# Patient Record
Sex: Female | Born: 1965 | Race: Black or African American | Hispanic: No | Smoking: Never smoker
Health system: Southern US, Community
[De-identification: ages and names within clinical notes are randomized; demographics above are authoritative.]

## PROBLEM LIST (undated history)

## (undated) DIAGNOSIS — C801 Malignant (primary) neoplasm, unspecified: Secondary | ICD-10-CM

## (undated) DIAGNOSIS — D279 Benign neoplasm of unspecified ovary: Secondary | ICD-10-CM

## (undated) DIAGNOSIS — IMO0001 Reserved for inherently not codable concepts without codable children: Secondary | ICD-10-CM

## (undated) DIAGNOSIS — D649 Anemia, unspecified: Secondary | ICD-10-CM

## (undated) HISTORY — PX: OTHER SURGICAL HISTORY: SHX169

---

## 1998-03-03 ENCOUNTER — Emergency Department (HOSPITAL_COMMUNITY): Admission: EM | Admit: 1998-03-03 | Discharge: 1998-03-03 | Payer: Self-pay | Admitting: Emergency Medicine

## 1998-03-03 ENCOUNTER — Encounter: Payer: Self-pay | Admitting: Emergency Medicine

## 2007-07-24 ENCOUNTER — Encounter: Payer: Self-pay | Admitting: Family Medicine

## 2007-07-24 ENCOUNTER — Emergency Department (HOSPITAL_COMMUNITY): Admission: EM | Admit: 2007-07-24 | Discharge: 2007-07-24 | Payer: Self-pay | Admitting: Emergency Medicine

## 2007-07-24 LAB — CONVERTED CEMR LAB: Sodium: 136 meq/L

## 2007-08-18 ENCOUNTER — Ambulatory Visit: Payer: Self-pay | Admitting: Family Medicine

## 2007-08-18 ENCOUNTER — Encounter: Payer: Self-pay | Admitting: *Deleted

## 2007-08-18 DIAGNOSIS — Z9189 Other specified personal risk factors, not elsewhere classified: Secondary | ICD-10-CM | POA: Insufficient documentation

## 2007-08-18 LAB — CONVERTED CEMR LAB
BUN: 23 mg/dL
CO2: 23 meq/L
Chloride: 105 meq/L
Creatinine, Ser: 1.05 mg/dL
Glucose, Bld: 90 mg/dL
Potassium: 4.1 meq/L

## 2010-07-23 NOTE — Consult Note (Signed)
NAME:  Suzanne [name], Suzanne [name]          ACCOUNT NO.:  [id number]   MEDICAL RECORD NO.:  [id number]          PATIENT TYPE:  EMS   LOCATION:  [facility]                         FACILITY:  [facility]   PHYSICIAN:  Pearlean Brownie, M.D.DATE OF BIRTH:  Jul 12, 1965   DATE OF CONSULTATION:  DATE OF DISCHARGE:                                 CONSULTATION   CHIEF COMPLAINT:  Chest pain.   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  The patient notes  that the pain has a heavy, achy feeling, and is always present in the  background, but is worse with increased periods of stress.  The patient  feels like it has been a little more prominent recently, now her classes  have finished and she has more time to focus on it.  The patient notes  that the pain is worse with activity, but is better with rest especially  when she gets a few nights of rest in a row.  There is no radiation  through her jaw or her arm.  There is no numbness or tingling due to the  chest pain.  The patient occasionally has shortness of breath, but this  occurs with or without the chest pain.  The patient came in today to the  emergency department not because the pain was worse, but because she did  not have to work and had time.  The patient did not know where to go  be seen for this, so she came to the emergency department.  Of note, the  patient has no primary care [facility] and is currently uninsured.   MEDICATIONS:  1. Advil p.r.n.  2. Multivitamin daily.  3. Calcium daily.  4. Biotin daily.  5. __________ acid daily.  6. Phosphatidylserine.   ALLERGIES:  No known drug allergies.   PAST MEDICAL HISTORY:  1. 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  The patient had one-third of one ovary and two-      thirds of the other ovary remaining per  her report.  2. Ovarian cyst in 2001, removed by surgery.  The patient reports that      these cysts have returned.  The patient reports no other medical      problems for surgery.  The patient reports that her lipids have      been checked in the past that are borderline.   FAMILY HISTORY:  The patient's mother is in good health.  Maternal  grandmother died of congestive heart failure.  Maternal uncle had a  pacemaker placed.  Maternal aunt diet of coronary artery disease, first  cousin died of an enlarged heart per the patient's report.  There is no  history of early cardiac death, except for this cousin who died at age  61.  Of note, the patient's husband died at age 22 of a heart attack.   SOCIAL HISTORY:  The patient is a nonsmoker, occasional  alcohol use.  Denies any illegal drug use.  The patient works at [employer] part time and is  a Naval architect to become a Runner, broadcasting/film/video.  The patient reports no recent  stressors.  She lives alone.  The patient states that she tries to walk  for 1 hour a day for exercise.   REVIEW OF SYSTEMS:  The patient denies any bowel or bladder problems,  denies any back pain.  She states that she has occasional leg aching and  reports 1 or 2 varicose veins.  Chest pain symptoms are not worse with  eating.  They do not get worse at night.  The patient admits to some  problems with stress and worrying, but denies any panic attacks and has  not had any signs of impending doom.   PHYSICAL EXAMINATION:  VITAL SIGNS:  Temperature 98.7, pulse 69,  respiratory rate 20, blood pressure 117/70, and O2 sat 98% on room air.  GENERAL:  In no acute distress, alert and comfortable.  HEENT:  Pupils are equal, around, and reactive to light.  Extraocular  muscles were intact.  Conjunctivae are pink and sclerae are clear.  Funduscopic exam is negative for any retinal hemorrhage.  Disks are  normal.  Oropharynx is pink and moist without erythema or exudate.  NECK:  No thyromegaly.   Neck is soft and nontender.  CARDIOVASCULAR:  Regular rate and rhythm.  No murmurs, gallops, or rubs.  LUNGS:  Clear to auscultation bilaterally.  Work of breathing is  unlabored.  No wheezes, rales, or rhonchi.  CHEST:  Chest wall, pain is worse with palpation in xiphoid process.  There is no costovertebral angle tenderness.  ABDOMEN:  The patient is obese.  Positive bowel sounds.  Soft and  nontender.  There is no rebound or guarding.  EXTREMITIES:  2+ dorsalis pedis and radial pulses.  There is no  cyanosis, clubbing, or edema.  Extremities are warm and well-perfused.  SKIN:  Warm and well-hydrated.  There is no rash to inspection.   LABORATORY DATA:  I-STAT laboratory shows sodium of 136, potassium of  4.1, and chloride of 105.  BUN of 23, creatinine of 0.9, and CO2 of 23.  Hemoglobin of 12.9 and hematocrit is 38.0.  One set of cardiac enzymes  were drawn, CK-MB is less than 1.0 and troponin is less than 0.05.  Chest x-ray performed shows no acute abnormality.  EKG performed in the  emergency department shows normal sinus rhythm with no ST changes.  No  ST elevation or depression.   ASSESSMENT AND PLAN:  The patient is a 45 year old African American  female with no prior cardiac history, who presents a for long-standing  chest pain.  1. Chest pain.  This is not acute on presentation.  The patient      presents to the emergency department seeking evaluation for this      chronic problem.  EKG is normal.  Chest x-ray is normal.  Her      cardiac enzymes x1 are normal.  This is likely not cardiac in      etiology.  The patient has no significant family history of      coronary artery disease.  We will set the patient up with [facility] to get established      and provide primary care.  A referral to further primary care and      proceed with further outpatient cardiac workup  and further risk      stratification including likely stress  test, fasting lipid panel,      and another workup as indicated.  The patient is agreeable to this      and prefers discharged to home as she states she does need to go to      work tomorrow.  We will provide the patient with the prescription      for nitroglycerin and also advised aspirin with any chest pain      exacerbations.   1. Stress.  This is possibly the ultimate etiology of her complaint,      although this is not completely clear.  The patient does not      require any pharmacological treatment at this time.   DISPOSITION:  We will discharge the patient to home and asked [facility] staff to schedule the patient in an hospital  followup clinic in 2-3 weeks.  The patient's phone number is [phone].  The patient has been provided with the [facility]  phone number as well.   Pending result issues to be followed up.  The patient should have a  fasting lipid panel and consideration for exercise stress testing with  the baseline EKG performed during this emergency department stay.  The  patient should have other risk factor modification measures taken as  necessary.      Myrtie Soman, MD  Electronically Signed      Pearlean Brownie, M.D.  Electronically Signed    TE/MEDQ  D:  07/24/2007  T:  07/25/2007  Job:  [id number]

## 2010-12-04 LAB — POCT I-STAT, CHEM 8
BUN: 23
Calcium, Ion: 1.05 — ABNORMAL LOW
Chloride: 105
Creatinine, Ser: 0.9
Glucose, Bld: 90
HCT: 38
Hemoglobin: 12.9
Potassium: 4.1
Sodium: 136
TCO2: 23

## 2010-12-04 LAB — POCT CARDIAC MARKERS
CKMB, poc: <1 — ABNORMAL LOW
Myoglobin, poc: 59.6
Operator id: 294521
Troponin i, poc: <0.05

## 2012-01-28 ENCOUNTER — Ambulatory Visit (INDEPENDENT_AMBULATORY_CARE_PROVIDER_SITE_OTHER): Payer: BC Managed Care – PPO | Admitting: Family Medicine

## 2012-01-28 VITALS — BP 111/69 | HR 89 | Temp 98.7°F | Resp 16 | Ht 64.0 in | Wt 206.0 lb

## 2012-01-28 DIAGNOSIS — R42 Dizziness and giddiness: Secondary | ICD-10-CM

## 2012-01-28 DIAGNOSIS — R7309 Other abnormal glucose: Secondary | ICD-10-CM

## 2012-01-28 DIAGNOSIS — R739 Hyperglycemia, unspecified: Secondary | ICD-10-CM

## 2012-01-28 LAB — POCT UA - MICROSCOPIC ONLY
Bacteria, U Microscopic: NEGATIVE
Casts, Ur, LPF, POC: NEGATIVE
Crystals, Ur, HPF, POC: POSITIVE
Mucus, UA: NEGATIVE
RBC, urine, microscopic: NEGATIVE
Yeast, UA: NEGATIVE

## 2012-01-28 LAB — POCT SEDIMENTATION RATE: POCT SED RATE: 52 mm/hr — AB (ref 0–22)

## 2012-01-28 LAB — COMPREHENSIVE METABOLIC PANEL
ALT: 14 U/L (ref 0–35)
AST: 19 U/L (ref 0–37)
Albumin: 3.9 g/dL (ref 3.5–5.2)
Alkaline Phosphatase: 64 U/L (ref 39–117)
BUN: 12 mg/dL (ref 6–23)
CO2: 27 mEq/L (ref 19–32)
Calcium: 8.9 mg/dL (ref 8.4–10.5)
Chloride: 106 mEq/L (ref 96–112)
Creat: 0.97 mg/dL (ref 0.50–1.10)
Glucose, Bld: 126 mg/dL — ABNORMAL HIGH (ref 70–99)
Potassium: 4 mEq/L (ref 3.5–5.3)
Sodium: 138 mEq/L (ref 135–145)
Total Bilirubin: 0.3 mg/dL (ref 0.3–1.2)
Total Protein: 6.7 g/dL (ref 6.0–8.3)

## 2012-01-28 LAB — POCT CBC
Granulocyte percent: 72.2 %G (ref 37–80)
HCT, POC: 41.9 % (ref 37.7–47.9)
Hemoglobin: 12.7 g/dL (ref 12.2–16.2)
Lymph, poc: 2.2 (ref 0.6–3.4)
MCH, POC: 28.6 pg (ref 27–31.2)
MCHC: 30.3 g/dL — AB (ref 31.8–35.4)
MCV: 94.3 fL (ref 80–97)
MID (cbc): 0.3 (ref 0–0.9)
MPV: 8.2 fL (ref 0–99.8)
POC Granulocyte: 6.6 (ref 2–6.9)
POC LYMPH PERCENT: 24.5 %L (ref 10–50)
POC MID %: 3.3 %M (ref 0–12)
Platelet Count, POC: 344 10*3/uL (ref 142–424)
RBC: 4.44 M/uL (ref 4.04–5.48)
RDW, POC: 15.3 %
WBC: 9.1 10*3/uL (ref 4.6–10.2)

## 2012-01-28 LAB — POCT URINALYSIS DIPSTICK
Bilirubin, UA: NEGATIVE
Blood, UA: NEGATIVE
Glucose, UA: NEGATIVE
Leukocytes, UA: NEGATIVE
Nitrite, UA: NEGATIVE
Protein, UA: NEGATIVE
Spec Grav, UA: >=1.03
Urobilinogen, UA: 0.2
pH, UA: 5.5

## 2012-01-28 LAB — TSH: TSH: 1.887 u[IU]/mL (ref 0.350–4.500)

## 2012-01-28 LAB — POCT GLYCOSYLATED HEMOGLOBIN (HGB A1C): Hemoglobin A1C: 6.3

## 2012-01-28 LAB — GLUCOSE, POCT (MANUAL RESULT ENTRY): POC Glucose: 137 mg/dl — AB (ref 70–99)

## 2012-01-28 NOTE — Patient Instructions (Addendum)
I really think much of your dizziness and malaise stems from extreme job stress and lack of sleep.  I am checking your other labs to make sure there is nothing wrong with liver or kidneys.  If symptoms persist, I will refer you to a neurologist.    Stress Stress-related medical problems are becoming increasingly common. The body has a built-in physical response to stressful situations. Faced with pressure, challenge or danger, we need to react quickly. Our bodies release hormones such as cortisol and adrenaline to help do this. These hormones are part of the "fight or flight" response and affect the metabolic rate, heart rate and blood pressure, resulting in a heightened, stressed state that prepares the body for optimum performance in dealing with a stressful situation. It is likely that early man required these mechanisms to stay alive, but usually modern stresses do not call for this, and the same hormones released in today's world can damage health and reduce coping ability. CAUSES  Pressure to perform at work, at school or in sports.  Threats of physical violence.  Money worries.  Arguments.  Family conflicts.  Divorce or separation from significant other.  Bereavement.  New job or unemployment.  Changes in location.  Alcohol or drug abuse. SOMETIMES, THERE IS NO PARTICULAR REASON FOR DEVELOPING STRESS. Almost all people are at risk of being stressed at some time in their lives. It is important to know that some stress is temporary and some is long term.  Temporary stress will go away when a situation is resolved. Most people can cope with short periods of stress, and it can often be relieved by relaxing, taking a walk, chatting through issues with friends, or having a good night's sleep.  Chronic (long-term, continuous) stress is much harder to deal with. It can be psychologically and emotionally damaging. It can be harmful both for an individual and for friends and  family. SYMPTOMS Everyone reacts to stress differently. There are some common effects that help [name] recognize it. In times of extreme stress, people may:  Shake uncontrollably.  Breathe faster and deeper than normal (hyperventilate).  Vomit.  For people with asthma, stress can trigger an attack.  For some people, stress may trigger migraine headaches, ulcers, and body pain. PHYSICAL EFFECTS OF STRESS MAY INCLUDE:  Loss of energy.  Skin problems.  Aches and pains resulting from tense muscles, including neck ache, backache and tension headaches.  Increased pain from arthritis and other conditions.  Irregular heart beat (palpitations).  Periods of irritability or anger.  Apathy or depression.  Anxiety (feeling uptight or worrying).  Unusual behavior.  Loss of appetite.  Comfort eating.  Lack of concentration.  Loss of, or decreased, sex-drive.  Increased smoking, drinking, or recreational drug use.  For women, missed periods.  Ulcers, joint pain, and muscle pain. Post-traumatic stress is the stress caused by any serious accident, strong emotional damage, or extremely difficult or violent experience such as rape or war. Post-traumatic stress victims can experience mixtures of emotions such as fear, shame, depression, guilt or anger. It may include recurrent memories or images that may be haunting. These feelings can last for weeks, months or even years after the traumatic event that triggered them. Specialized treatment, possibly with medicines and psychological therapies, is available. If stress is causing physical symptoms, severe distress or making it difficult for you to function as normal, it is worth seeing your caregiver. It is important to remember that although stress is a usual part of life,  extreme or prolonged stress can lead to other illnesses that will need treatment. It is better to visit a doctor sooner rather than later. Stress has been linked to the  development of high blood pressure and heart disease, as well as insomnia and depression. There is no diagnostic test for stress since everyone reacts to it differently. But a caregiver will be able to spot the physical symptoms, such as:  Headaches.  Shingles.  Ulcers. Emotional distress such as intense worry, low mood or irritability should be detected when the doctor asks pertinent questions to identify any underlying problems that might be the cause. In case there are physical reasons for the symptoms, the doctor may also want to do some tests to exclude certain conditions. If you feel that you are suffering from stress, try to identify the aspects of your life that are causing it. Sometimes you may not be able to change or avoid them, but even a small change can have a positive ripple effect. A simple lifestyle change can make all the difference. STRATEGIES THAT CAN HELP DEAL WITH STRESS:  Delegating or sharing responsibilities.  Avoiding confrontations.  Learning to be more assertive.  Regular exercise.  Avoid using alcohol or street drugs to cope.  Eating a healthy, balanced diet, rich in fruit and vegetables and proteins.  Finding humor or absurdity in stressful situations.  Never taking on more than you know you can handle comfortably.  Organizing your time better to get as much done as possible.  Talking to friends or family and sharing your thoughts and fears.  Listening to music or relaxation tapes.  Tensing and then relaxing your muscles, starting at the toes and working up to the head and neck. If you think that you would benefit from help, either in identifying the things that are causing your stress or in learning techniques to help you relax, see a caregiver who is capable of helping you with this. Rather than relying on medications, it is usually better to try and identify the things in your life that are causing stress and try to deal with them. There are many  techniques of managing stress including counseling, psychotherapy, aromatherapy, yoga, and exercise. Your caregiver can help you determine what is best for you. Document Released: 05/17/2002 Document Revised: 05/19/2011 Document Reviewed: 04/13/2007 [facility] Patient Information 2013 [place], [place].

## 2012-01-28 NOTE — Progress Notes (Signed)
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  Non positional,  But worsened by activity.  Job is very stressful.   She has lost 20 lbs. This past year.  Onset: off and on over a year.  F/H:  Negative for diabetes, hypertension  Patient says she  Is premenstrual.  Objective:  Alert, appropriate, moving easily in office.  She broke into tears when describing her job. Eyes:  Normal fundi Tm's:  Normal Oroph: normal Neck:  Supple, no bruits, no thyromegaly Chest:  Clear Heart:  Regular, no murmur heard, no gallop Abdomen: soft, nontender, no HSM or mass Skin:  Clear Results for orders placed in visit on 01/28/12  POCT CBC      Component Value Range   WBC 9.1  4.6 - 10.2 K/uL   Lymph, poc 2.2  0.6 - 3.4   POC LYMPH PERCENT 24.5  10 - 50 %L   MID (cbc) 0.3  0 - 0.9   POC MID % 3.3  0 - 12 %M   POC Granulocyte 6.6  2 - 6.9   Granulocyte percent 72.2  37 - 80 %G   RBC 4.44  4.04 - 5.48 M/uL   Hemoglobin 12.7  12.2 - 16.2 g/dL   HCT, POC 16.1  09.6 - 47.9 %   MCV 94.3  80 - 97 fL   MCH, POC 28.6  27 - 31.2 pg   MCHC 30.3 (*) 31.8 - 35.4 g/dL   RDW, POC 04.5     Platelet Count, POC 344  142 - 424 K/uL   MPV 8.2  0 - 99.8 fL  GLUCOSE, POCT (MANUAL RESULT ENTRY)      Component Value Range   POC Glucose 137 (*) 70 - 99 mg/dl   Hgb W0J is 6.3  Assessment:  Patient is clearly stressed and is not sleeping enough.  I pointed this out to the patient, but she still thinks there is something wrong.  The blood sugar suggests patient is prediabetic  Plan:  1. Dizziness, nonspecific  POCT CBC, POCT glucose (manual entry), Comprehensive metabolic panel, POCT UA - Microscopic Only, POCT urinalysis dipstick, POCT SEDIMENTATION RATE, TSH  2. Elevated blood sugar  POCT glycosylated hemoglobin (Hb A1C)   Recommend CPE and possible neurology consult.

## 2012-03-17 ENCOUNTER — Ambulatory Visit (INDEPENDENT_AMBULATORY_CARE_PROVIDER_SITE_OTHER): Payer: BC Managed Care – PPO | Admitting: Family Medicine

## 2012-03-17 ENCOUNTER — Encounter: Payer: Self-pay | Admitting: Family Medicine

## 2012-03-17 VITALS — BP 116/70 | HR 89 | Temp 97.7°F | Resp 16 | Ht 66.0 in | Wt 202.0 lb

## 2012-03-17 DIAGNOSIS — Z862 Personal history of diseases of the blood and blood-forming organs and certain disorders involving the immune mechanism: Secondary | ICD-10-CM

## 2012-03-17 DIAGNOSIS — Z8639 Personal history of other endocrine, nutritional and metabolic disease: Secondary | ICD-10-CM

## 2012-03-17 DIAGNOSIS — R6889 Other general symptoms and signs: Secondary | ICD-10-CM

## 2012-03-17 DIAGNOSIS — Z0000 Encounter for general adult medical examination without abnormal findings: Secondary | ICD-10-CM

## 2012-03-17 DIAGNOSIS — R899 Unspecified abnormal finding in specimens from other organs, systems and tissues: Secondary | ICD-10-CM

## 2012-03-17 DIAGNOSIS — Z124 Encounter for screening for malignant neoplasm of cervix: Secondary | ICD-10-CM

## 2012-03-17 DIAGNOSIS — Z1231 Encounter for screening mammogram for malignant neoplasm of breast: Secondary | ICD-10-CM

## 2012-03-17 DIAGNOSIS — Z01419 Encounter for gynecological examination (general) (routine) without abnormal findings: Secondary | ICD-10-CM

## 2012-03-17 DIAGNOSIS — R5381 Other malaise: Secondary | ICD-10-CM

## 2012-03-17 DIAGNOSIS — N760 Acute vaginitis: Secondary | ICD-10-CM

## 2012-03-17 DIAGNOSIS — R5383 Other fatigue: Secondary | ICD-10-CM

## 2012-03-17 LAB — POCT URINALYSIS DIPSTICK
Leukocytes, UA: NEGATIVE
Nitrite, UA: NEGATIVE
Protein, UA: NEGATIVE
Urobilinogen, UA: 0.2
pH, UA: 6

## 2012-03-17 LAB — POCT WET PREP WITH KOH

## 2012-03-17 LAB — LIPID PANEL
LDL Cholesterol: 139 mg/dL — ABNORMAL HIGH (ref 0–99)
Triglycerides: 123 mg/dL (ref <150)

## 2012-03-17 MED ORDER — METRONIDAZOLE 500 MG PO TABS: ORAL_TABLET | ORAL | Status: DC

## 2012-03-17 NOTE — Progress Notes (Signed)
Subjective:    Patient ID: Suzanne [name], female    DOB: Sep 13, 1965, 47 y.o.   MRN: [id number]  HPI  This 47 y.o. AA female is here for CPE/ PAP. She takes no prescription medications. She c/o decreased appetite with dizziness. There has been some mild fatigue and arthralgias; she had an elevated ESR in Nov 2103.  Health care maintenance issues need to be addressed.   Review of Systems  Constitutional: Positive for appetite change and fatigue. Negative for fever, chills and unexpected weight change.  HENT: Positive for dental problem.   Eyes: Positive for visual disturbance.  Respiratory: Positive for cough.   Cardiovascular: Negative.   Gastrointestinal: Negative.   Musculoskeletal: Positive for arthralgias. Negative for myalgias, back pain and joint swelling.  Neurological: Positive for dizziness and light-headedness.  Hematological: Negative.   Psychiatric/Behavioral: Negative.        Objective:   Physical Exam  Nursing note and vitals reviewed. Constitutional: She is oriented to person, place, and time. Vital signs are normal. She appears well-developed and well-nourished. No distress.  HENT:  Head: Normocephalic and atraumatic.  Right Ear: Hearing, tympanic membrane, external ear and ear canal normal.  Left Ear: Hearing, tympanic membrane, external ear and ear canal normal.  Nose: Nose normal. No nasal deformity or septal deviation. Right sinus exhibits no maxillary sinus tenderness and no frontal sinus tenderness. Left sinus exhibits no maxillary sinus tenderness and no frontal sinus tenderness.  Mouth/Throat: Uvula is midline, oropharynx is clear and moist and mucous membranes are normal. No oral lesions. Normal dentition. No dental caries.  Eyes: Conjunctivae normal, EOM and lids are normal. Pupils are equal, round, and reactive to light. No scleral icterus.  Fundoscopic exam:      The right eye shows no arteriolar narrowing, no AV nicking and no papilledema. The  right eye shows red reflex.      The left eye shows no arteriolar narrowing, no AV nicking and no papilledema. The left eye shows red reflex. Neck: Normal range of motion. Neck supple. No thyromegaly present.  Cardiovascular: Normal rate, regular rhythm, normal heart sounds and intact distal pulses.  Exam reveals no gallop and no friction rub.   No murmur heard. Pulmonary/Chest: Effort normal and breath sounds normal. No respiratory distress. Right breast exhibits no inverted nipple, no mass, no nipple discharge, no skin change and no tenderness. Left breast exhibits no inverted nipple, no mass, no nipple discharge, no skin change and no tenderness. Breasts are symmetrical.  Abdominal: Soft. Normal appearance and bowel sounds are normal. She exhibits no distension, no abdominal bruit, no pulsatile midline mass and no mass. There is no hepatosplenomegaly. There is no tenderness. There is no rebound, no guarding and no CVA tenderness. No hernia.  Genitourinary: Rectum normal, vagina normal and uterus normal. Rectal exam shows no external hemorrhoid, no fissure, no mass and anal tone normal. Guaiac negative stool. There is no rash, tenderness or lesion on the right labia. There is no rash, tenderness or lesion on the left labia. Uterus is not enlarged and not tender. Cervix exhibits discharge. Cervix exhibits no motion tenderness and no friability. Right adnexum displays no mass, no tenderness and no fullness. Left adnexum displays no mass, no tenderness and no fullness. No erythema around the vagina. No foreign body around the vagina. No vaginal discharge found.  Musculoskeletal: Normal range of motion. She exhibits no edema and no tenderness.  Lymphadenopathy:    She has no cervical adenopathy.  Right: No inguinal adenopathy present.       Left: No inguinal adenopathy present.  Neurological: She is alert and oriented to person, place, and time. She has normal reflexes. No cranial nerve deficit. She  exhibits normal muscle tone. Coordination normal.  Skin: Skin is warm and dry. No rash noted. No erythema. No pallor.  Psychiatric: She has a normal mood and affect. Her behavior is normal. Judgment and thought content normal.          Assessment & Plan:   1. Routine general medical examination at a health care facility  POCT urinalysis dipstick, Lipid panel, IFOBT POC (occult bld, rslt in office)  2. Encounter for cervical Pap smear with pelvic exam  Pap IG, CT/NG w/ reflex HPV when ASC-U  3. Fatigue  Vitamin D, 25-hydroxy  4. Abnormal laboratory test result  Sedimentation rate  5. H/O hyperglycemia  A1c= 6.3% in Nov 2013. Pt advised that she has borderline Diabetes-encouraged better nutrition and more active lifestyle for weight reduction.  6. Vaginitis - Bacterial Vaginosis POCT Wet Prep with KOH, metroNIDAZOLE (FLAGYL) 500 MG tablet  7. Other screening mammogram  MM Digital Screening

## 2012-03-17 NOTE — Patient Instructions (Addendum)
Keeping You Healthy  Get These Tests  Blood Pressure- Have your blood pressure checked by your healthcare provider at least once a year.  Normal blood pressure is 120/80.  Weight- Have your body mass index (BMI) calculated to screen for obesity.  BMI is a measure of body fat based on height and weight.  You can calculate your own BMI at www.nhlbisupport.com/bmi/  Cholesterol- Have your cholesterol checked every year.  Diabetes- Have your blood sugar checked every year if you have high blood pressure, high cholesterol, a family history of diabetes or if you are overweight.  Pap Smear- Have a pap smear every 1 to 3 years if you have been sexually active.  If you are older than 65 and recent pap smears have been normal you may not need additional pap smears.  In addition, if you have had a hysterectomy  For benign disease additional pap smears are not necessary.  Mammogram-Yearly mammograms are essential for early detection of breast cancer  Screening for Colon Cancer- Colonoscopy starting at age 49. Screening may begin sooner depending on your family history and other health conditions.  Follow up colonoscopy as directed by your Gastroenterologist.  Screening for Osteoporosis- Screening begins at age 65 with bone density scanning, sooner if you are at higher risk for developing Osteoporosis.  Get these medicines  Calcium with Vitamin D- Your body requires 1200-1500 mg of Calcium a day and 800-1000 IU of Vitamin D a day.  You can only absorb 500 mg of Calcium at a time therefore Calcium must be taken in 2 or 3 separate doses throughout the day.  Hormones- Hormone therapy has been associated with increased risk for certain cancers and heart disease.  Talk to your healthcare provider about if you need relief from menopausal symptoms.  Aspirin- Ask your healthcare provider about taking Aspirin to prevent Heart Disease and Stroke.  Get these Immuniztions  Flu shot- Every fall  Pneumonia  shot- Once after the age of 49; if you are younger ask your healthcare provider if you need a pneumonia shot.  Tetanus- Every ten years.  Zostavax- Once after the age of 49 to prevent shingles.  Take these steps  Don't smoke- Your healthcare provider can help you quit. For tips on how to quit, ask your healthcare provider or go to www.smokefree.gov or call 1-800 QUIT-NOW.  Be physically active- Exercise 5 days a week for a minimum of 30 minutes.  If you are not already physically active, start slow and gradually work up to 30 minutes of moderate physical activity.  Try walking, dancing, bike riding, swimming, etc.  Eat a healthy diet- Eat a variety of healthy foods such as fruits, vegetables, whole grains, low fat milk, low fat cheeses, yogurt, lean meats, chicken, fish, eggs, dried beans, tofu, etc.  For more information go to www.thenutritionsource.org  Dental visit- Brush and floss teeth twice daily; visit your dentist twice a year.  Eye exam- Visit your Optometrist or Ophthalmologist yearly.  Drink alcohol in moderation- Limit alcohol intake to one drink or less a day.  Never drink and drive.  Depression- Your emotional health is as important as your physical health.  If you're feeling down or losing interest in things you normally enjoy, please talk to your healthcare provider.  Seat Belts- can save your life; always wear one  Smoke/Carbon Monoxide detectors- These detectors need to be installed on the appropriate level of your home.  Replace batteries at least once a year.  Violence- If anyone   is threatening or hurting you, please tell your healthcare provider.  Living Will/ Health care power of attorney- Discuss with your healthcare provider and family.     1800 Calorie Diet for Diabetes Meal Planning The 1800 calorie diet is designed for eating up to 1800 calories each day. Following this diet and making healthy meal choices can help improve overall health. This diet  controls blood sugar (glucose) levels and can also help lower blood pressure and cholesterol. SERVING SIZES Measuring foods and serving sizes helps to make sure you are getting the right amount of food. The list below tells how big or small some common serving sizes are:  1 oz.........4 stacked dice.  3 oz........Marland KitchenDeck of cards.  1 tsp.......Marland KitchenTip of little finger.  1 tbs......Marland KitchenMarland KitchenThumb.  2 tbs.......Marland KitchenGolf ball.   cup......Marland KitchenHalf of a fist.  1 cup.......Marland KitchenA fist. GUIDELINES FOR CHOOSING FOODS The goal of this diet is to eat a variety of foods and limit calories to 1800 each day. This can be done by choosing foods that are low in calories and fat. The diet also suggests eating small amounts of food frequently. Doing this helps control your blood glucose levels so they do not get too high or too low. Each meal or snack may include a protein food source to help you feel more satisfied and to stabilize your blood glucose. Try to eat about the same amount of food around the same time each day. This includes weekend days, travel days, and days off work. Space your meals about 4 to 5 hours apart and add a snack between them if you wish.  For example, a daily food plan could include breakfast, a morning snack, lunch, dinner, and an evening snack. Healthy meals and snacks include whole grains, vegetables, fruits, lean meats, poultry, fish, and dairy products. As you plan your meals, select a variety of foods. Choose from the bread and starch, vegetable, fruit, dairy, and meat/protein groups. Examples of foods from each group and their suggested serving sizes are listed below. Use measuring cups and spoons to become familiar with what a healthy portion looks like. Bread and Starch Each serving equals 15 grams of carbohydrates.  1 slice bread.   bagel.   cup cold cereal (unsweetened).   cup hot cereal or mashed potatoes.  1 small potato (size of a computer mouse).   cup cooked pasta or  rice.   English muffin.  1 cup broth-based soup.  3 cups of popcorn.  4 to 6 whole-wheat crackers.   cup cooked beans, peas, or corn. Vegetable Each serving equals 5 grams of carbohydrates.   cup cooked vegetables.  1 cup raw vegetables.   cup tomato or vegetable juice. Fruit Each serving equals 15 grams of carbohydrates.  1 small apple or orange.  1 cup watermelon or strawberries.   cup applesauce (no sugar added).  2 tbs raisins.   banana.   cup canned fruit, packed in water, its own juice, or sweetened with a sugar substitute.   cup unsweetened fruit juice. Dairy Each serving equals 12 to 15 grams of carbohydrates.  1 cup fat-free milk.  6 oz artificially sweetened yogurt or plain yogurt.  1 cup low-fat buttermilk.  1 cup soy milk.  1 cup almond milk. Meat/Protein  1 large egg.  2 to 3 oz meat, poultry, or fish.   cup low-fat cottage cheese.  1 tbs peanut butter.  1 oz low-fat cheese.   cup tuna in water.   cup tofu. Fat  1 tsp oil.  1 tsp trans-fat-free margarine.  1 tsp butter.  1 tsp mayonnaise.  2 tbs avocado.  1 tbs salad dressing.  1 tbs cream cheese.  2 tbs sour cream. SAMPLE 1800 CALORIE DIET PLAN Breakfast   cup unsweetened cereal (1 carb serving).  1 cup fat-free milk (1 carb serving).  1 slice whole-wheat toast (1 carb serving).   small banana (1 carb serving).  1 scrambled egg.  1 tsp trans-fat-free margarine. Lunch  Tuna sandwich.  2 slices whole-wheat bread (2 carb servings).   cup canned tuna in water, drained.  1 tbs reduced fat mayonnaise.  1 stalk celery, chopped.  2 slices tomato.  1 lettuce leaf.  1 cup carrot sticks.  24 to 30 seedless grapes (2 carb servings).  6 oz light yogurt (1 carb serving). Afternoon Snack  3 graham cracker squares (1 carb serving).  Fat-free milk, 1 cup (1 carb serving).  1 tbs peanut butter. Dinner  3 oz salmon, broiled with 1 tsp  oil.  1 cup mashed potatoes (2 carb servings) with 1 tsp trans-fat-free margarine.  1 cup fresh or frozen green beans.  1 cup steamed asparagus.  1 cup fat-free milk (1 carb serving). Evening Snack  3 cups air-popped popcorn (1 carb serving).  2 tbs parmesan cheese sprinkled on top. MEAL PLAN Use this worksheet to help you make a daily meal plan based on the 1800 calorie diet suggestions. If you are using this plan to help you control your blood glucose, you may interchange carbohydrate-containing foods (dairy, starches, and fruits). Select a variety of fresh foods of varying colors and flavors. The total amount of carbohydrate in your meals or snacks is more important than making sure you include all of the food groups every time you eat. Choose from the following foods to build your day's meals:  8 Starches.  4 Vegetables.  3 Fruits.  2 Dairy.  6 to 7 oz Meat/Protein.  Up to 4 Fats. Your dietician can use this worksheet to help you decide how many servings and which types of foods are right for you. BREAKFAST Food Group and Servings / Food Choice Starch ________________________________________________________ Dairy _________________________________________________________ Fruit _________________________________________________________ Meat/Protein __________________________________________________ Fat ___________________________________________________________ LUNCH Food Group and Servings / Food Choice Starch ________________________________________________________ Meat/Protein __________________________________________________ Vegetable _____________________________________________________ Fruit _________________________________________________________ Dairy _________________________________________________________ Fat ___________________________________________________________ Aura Fey Food Group and Servings / Food Choice Starch  ________________________________________________________ Meat/Protein __________________________________________________ Fruit __________________________________________________________ Dairy _________________________________________________________ Laural Golden Food Group and Servings / Food Choice Starch _________________________________________________________ Meat/Protein ___________________________________________________ Dairy __________________________________________________________ Vegetable ______________________________________________________ Fruit ___________________________________________________________ Fat ____________________________________________________________ Lollie Sails Food Group and Servings / Food Choice Fruit __________________________________________________________ Meat/Protein ___________________________________________________ Dairy __________________________________________________________ Starch _________________________________________________________ DAILY TOTALS Starch ____________________________ Vegetable _________________________ Fruit _____________________________ Dairy _____________________________ Meat/Protein______________________ Fat _______________________________ Document Released: 09/16/2004 Document Revised: 05/19/2011 Document Reviewed: 01/10/2011 [employer] Patient Information 2013 [place], [place].     Bacterial Vaginosis Bacterial vaginosis (BV) is a vaginal infection where the normal balance of bacteria in the vagina is disrupted. The normal balance is then replaced by an overgrowth of certain bacteria. There are several different kinds of bacteria that can cause BV. BV is the most common vaginal infection in women of childbearing age. CAUSES   The cause of BV is not fully understood. BV develops when there is an increase or imbalance of harmful bacteria.  Some activities or behaviors can upset the normal balance of bacteria in the  vagina and put women at increased risk including:  Having a new sex partner or multiple sex partners.  Douching.  Using an intrauterine device (IUD) for contraception.  It is not clear what role sexual activity plays in the development of BV. However, women that have never had sexual intercourse are rarely infected with BV. Women do not get BV from toilet seats, bedding, swimming pools or from touching objects around them.  SYMPTOMS   Grey vaginal discharge.  A fish-like odor with discharge, especially after sexual intercourse.  Itching or burning of the vagina and vulva.  Burning or pain with urination.  Some women have no signs or symptoms at all. DIAGNOSIS  Your caregiver must examine the vagina for signs of BV. Your caregiver will perform lab tests and look at the sample of vaginal fluid through a microscope. They will look for bacteria and abnormal cells (clue cells), a pH test higher than 4.5, and a positive amine test all associated with BV.  RISKS AND COMPLICATIONS   Pelvic inflammatory disease (PID).  Infections following gynecology surgery.  Developing HIV.  Developing herpes virus. TREATMENT  Sometimes BV will clear up without treatment. However, all women with symptoms of BV should be treated to avoid complications, especially if gynecology surgery is planned. Female partners generally do not need to be treated. However, BV may spread between female sex partners so treatment is helpful in preventing a recurrence of BV.   BV may be treated with antibiotics. The antibiotics come in either pill or vaginal cream forms. Either can be used with nonpregnant or pregnant women, but the recommended dosages differ. These antibiotics are not harmful to the baby.  BV can recur after treatment. If this happens, a second round of antibiotics will often be prescribed.  Treatment is important for pregnant women. If not treated, BV can cause a premature delivery, especially for a  pregnant woman who had a premature birth in the past. All pregnant women who have symptoms of BV should be checked and treated.  For chronic reoccurrence of BV, treatment with a type of prescribed gel vaginally twice a week is helpful. HOME CARE INSTRUCTIONS   Finish all medication as directed by your caregiver.  Do not have sex until treatment is completed.  Tell your sexual partner that you have a vaginal infection. They should see their caregiver and be treated if they have problems, such as a mild rash or itching.  Practice safe sex. Use condoms. Only have 1 sex partner. PREVENTION  Basic prevention steps can help reduce the risk of upsetting the natural balance of bacteria in the vagina and developing BV:  Do not have sexual intercourse (be abstinent).  Do not douche.  Use all of the medicine prescribed for treatment of BV, even if the signs and symptoms go away.  Tell your sex partner if you have BV. That way, they can be treated, if needed, to prevent reoccurrence. SEEK MEDICAL CARE IF:   Your symptoms are not improving after 3 days of treatment.  You have increased discharge, pain, or fever. MAKE SURE YOU:   Understand these instructions.  Will watch your condition.  Will get help right away if you are not doing well or get worse. FOR MORE INFORMATION  Division of STD Prevention (DSTDP), Centers for Disease Control and Prevention: SolutionApps.co.za American Social Health Association (ASHA): www.ashastd.org  Document Released: 02/24/2005 Document Revised: 05/19/2011 Document Reviewed: 08/17/2008 [facility] Patient Information 2013 [place], [place].

## 2012-03-18 LAB — VITAMIN D 25 HYDROXY (VIT D DEFICIENCY, FRACTURES): Vit D, 25-Hydroxy: 18 ng/mL — ABNORMAL LOW (ref 30–89)

## 2012-03-19 LAB — PAP IG, CT-NG, RFX HPV ASCU: Chlamydia Probe Amp: NEGATIVE

## 2012-03-23 ENCOUNTER — Encounter: Payer: Self-pay | Admitting: Family Medicine

## 2012-03-24 ENCOUNTER — Other Ambulatory Visit: Payer: Self-pay | Admitting: Family Medicine

## 2012-03-24 MED ORDER — ERGOCALCIFEROL 1.25 MG (50000 UT) PO CAPS: 50000.0000 [IU] | ORAL_CAPSULE | ORAL | Status: DC

## 2012-03-24 NOTE — Progress Notes (Signed)
Quick Note:  Please call pt and advise that the following labs are abnormal... Vitamin level is very low; this is probably the reason that you are tired. I am prescribing a supplement (Vitamin D 16109 units) to be taken once a week. Also needs to increase Vitamin D foods in diet (fish, mushrooms, eggs and Vit D- fortified dairy products). Sun exposure for 15 minutes most days of the week is important. Sed rate test is still above normal but it is less than in Nov 2013. Will recheck this lab at next visit in April.  Lipid panel is good (Total cholesterol is a little above normal as is LDL- "bad" cholesterol). Eat as healthy as possible; check out information about the "Mediterranean Diet" online.  PAP is normal.  Copy to pt. ______

## 2012-04-05 ENCOUNTER — Ambulatory Visit (HOSPITAL_COMMUNITY)
Admission: RE | Admit: 2012-04-05 | Discharge: 2012-04-05 | Disposition: A | Discharge status: Home / Self Care | Payer: BC Managed Care – PPO | Source: Ambulatory Visit | Attending: Family Medicine | Admitting: Family Medicine

## 2012-04-05 DIAGNOSIS — Z1231 Encounter for screening mammogram for malignant neoplasm of breast: Secondary | ICD-10-CM | POA: Insufficient documentation

## 2012-04-07 ENCOUNTER — Telehealth: Payer: Self-pay

## 2012-04-07 DIAGNOSIS — N760 Acute vaginitis: Secondary | ICD-10-CM

## 2012-04-07 NOTE — Telephone Encounter (Signed)
Pt calling about an rx that was supposed to be called in to [facility] on [address]   Best number [phone]

## 2012-04-08 MED ORDER — ERGOCALCIFEROL 1.25 MG (50000 UT) PO CAPS: 50000.0000 [IU] | ORAL_CAPSULE | ORAL | Status: AC

## 2012-04-08 MED ORDER — METRONIDAZOLE 500 MG PO TABS: ORAL_TABLET | ORAL | Status: DC

## 2012-04-08 NOTE — Telephone Encounter (Signed)
Patient advised this was resent for her.

## 2012-04-08 NOTE — Telephone Encounter (Signed)
Medication (Flagyl) approved.

## 2012-04-08 NOTE — Telephone Encounter (Signed)
LMOM to CB to discuss which medication she is referring to.

## 2012-04-08 NOTE — Telephone Encounter (Signed)
Patient wants to know if we can send in the Flagyl in for her. She states she did not get it from the pharmacy when it was sent in for her at the beginning of the month. She also states she did not get Vitamin D rx either. I resubmitted this one, Please advise. Pended request for the Flagyl

## 2012-04-09 ENCOUNTER — Telehealth: Payer: Self-pay | Admitting: Radiology

## 2012-04-09 DIAGNOSIS — R928 Other abnormal and inconclusive findings on diagnostic imaging of breast: Secondary | ICD-10-CM

## 2012-04-09 NOTE — Telephone Encounter (Signed)
Message copied by Caffie Damme on Fri Apr 09, 2012 12:09 PM ------      Message from: Zenovia Jarred      Created: Wed Apr 07, 2012 12:21 PM      Regarding: Patient infor needs diag        Need epic order,poss asymmetry,right.      LTD diag right       Right breast US

## 2012-04-09 NOTE — Telephone Encounter (Signed)
Order generated, to you FYI patient has been called back for additional mammogram views and [name]. Suzanne [name]

## 2012-05-21 ENCOUNTER — Other Ambulatory Visit: Payer: BC Managed Care – PPO

## 2012-06-16 ENCOUNTER — Ambulatory Visit: Payer: BC Managed Care – PPO | Admitting: Family Medicine

## 2012-06-25 ENCOUNTER — Other Ambulatory Visit: Payer: BC Managed Care – PPO

## 2013-04-03 ENCOUNTER — Emergency Department (INDEPENDENT_AMBULATORY_CARE_PROVIDER_SITE_OTHER)
Admission: EM | Admit: 2013-04-03 | Discharge: 2013-04-03 | Disposition: A | Discharge status: Home / Self Care | Payer: Self-pay | Source: Home / Self Care

## 2013-04-03 ENCOUNTER — Encounter (HOSPITAL_COMMUNITY): Payer: Self-pay | Admitting: Emergency Medicine

## 2013-04-03 ENCOUNTER — Emergency Department (INDEPENDENT_AMBULATORY_CARE_PROVIDER_SITE_OTHER): Payer: Self-pay

## 2013-04-03 DIAGNOSIS — M25561 Pain in right knee: Secondary | ICD-10-CM

## 2013-04-03 DIAGNOSIS — M76892 Other specified enthesopathies of left lower limb, excluding foot: Secondary | ICD-10-CM

## 2013-04-03 DIAGNOSIS — S82113A Displaced fracture of unspecified tibial spine, initial encounter for closed fracture: Secondary | ICD-10-CM

## 2013-04-03 DIAGNOSIS — M6580 Other synovitis and tenosynovitis, unspecified site: Secondary | ICD-10-CM

## 2013-04-03 DIAGNOSIS — S82109A Unspecified fracture of upper end of unspecified tibia, initial encounter for closed fracture: Secondary | ICD-10-CM

## 2013-04-03 DIAGNOSIS — M25569 Pain in unspecified knee: Secondary | ICD-10-CM

## 2013-04-03 HISTORY — DX: Benign neoplasm of unspecified ovary: D27.9

## 2013-04-03 MED ORDER — HYDROCODONE-ACETAMINOPHEN 5-325 MG PO TABS
ORAL_TABLET | ORAL | Status: AC
  Filled 2013-04-03: qty 1

## 2013-04-03 MED ORDER — HYDROCODONE-ACETAMINOPHEN 5-325 MG PO TABS: 1.0000 | ORAL_TABLET | ORAL | Status: DC | PRN

## 2013-04-03 MED ORDER — NAPROXEN 375 MG PO TABS: 375.0000 mg | ORAL_TABLET | Freq: Two times a day (BID) | ORAL | Status: DC

## 2013-04-03 MED ORDER — HYDROCODONE-ACETAMINOPHEN 5-325 MG PO TABS
1.0000 | ORAL_TABLET | Freq: Once | ORAL | Status: AC
  Administered 2013-04-03: 1 via ORAL

## 2013-04-03 NOTE — ED Provider Notes (Signed)
CSN: [id number]     Arrival date & time 04/03/13  1619 History   First MD Initiated Contact with Patient 04/03/13 1720     Chief Complaint  Patient presents with  . Knee Pain   (Consider location/radiation/quality/duration/timing/severity/associated sxs/prior Treatment) HPI Comments: 48 year old female complaining of acute on chronic right knee pain for 2 weeks. She has had some swelling but eyes help diminish it. Today she stepped on her knee with full weightbearing and calls for much pain in the knee. Unable to bear full weight.   Past Medical History  Diagnosis Date  . Ovarian tumor (benign)    Past Surgical History  Procedure Laterality Date  . Ovarian tumor removal     Family History  Problem Relation Age of Onset  . Arthritis Mother    History  Substance Use Topics  . Smoking status: Never Smoker   . Smokeless tobacco: Not on file  . Alcohol Use: 0.0 oz/week     Comment: OCCASIONAL   OB History   Grav Para Term Preterm Abortions TAB SAB Ect Mult Living                 Review of Systems  Constitutional: Negative for fever, chills and activity change.  HENT: Negative.   Respiratory: Negative.   Cardiovascular: Negative.   Musculoskeletal:       As per HPI  Skin: Negative for color change, pallor and rash.  Neurological: Negative.     Allergies  Review of patient's allergies indicates no known allergies.  Home Medications   Current Outpatient Rx  Name  Route  Sig  Dispense  Refill  . Ascorbic Acid (VITAMIN C) 100 MG tablet   Oral   Take by mouth daily.         Marland Kitchen b complex vitamins tablet   Oral   Take 1 tablet by mouth daily.         . ergocalciferol (DRISDOL) 50000 UNITS capsule   Oral   Take 1 capsule (50,000 Units total) by mouth once a week.   4 capsule   5   . Multiple Vitamin (MULTIVITAMIN) tablet   Oral   Take 1 tablet by mouth daily.         Marland Kitchen HYDROcodone-acetaminophen (NORCO/VICODIN) 5-325 MG per tablet   Oral   Take 1 tablet  by mouth every 4 (four) hours as needed.   20 tablet   0   . metroNIDAZOLE (FLAGYL) 500 MG tablet      Take 1 tablet twice a day for 7 days.   14 tablet   0   . naproxen (NAPROSYN) 375 MG tablet   Oral   Take 1 tablet (375 mg total) by mouth 2 (two) times daily.   20 tablet   0    BP 137/72  Pulse 76  Temp(Src) 98.3 F (36.8 C) (Oral)  Resp 16  SpO2 100%  LMP 03/28/2013 Physical Exam  Nursing note and vitals reviewed. Constitutional: She is oriented to person, place, and time. She appears well-developed and well-nourished. No distress.  HENT:  Head: Normocephalic and atraumatic.  Eyes: EOM are normal.  Neck: Normal range of motion. Neck supple.  Musculoskeletal:  Knee without apparent swelling or discoloration. No anterior, lateral or medial tenderness. There is tenderness to the posterior tendons of the hamstring muscle. She is able to extend the knee but with pain. She is able to flex with some assistance to 100. She states the pain feels deep. Distal neurovascular motor  sensory is normal. Pedal pulses 2+.  Neurological: She is alert and oriented to person, place, and time. No cranial nerve deficit.  Skin: Skin is warm and dry.    ED Course  Procedures (including critical care time) Labs Review Labs Reviewed - No data to display Imaging Review Dg Knee Complete 4 Views Right  04/03/2013   CLINICAL DATA:  Pain.  EXAM: RIGHT KNEE - COMPLETE 4+ VIEW  COMPARISON:  None.  FINDINGS: Fracture of the medial tibial spine noted, age undetermined. MRI can be obtained for further evaluation. Tiny knee joint effusion cannot be excluded. Mild patellofemoral degenerative change present.  IMPRESSION: 1. Fracture of the medial tibial spine, age undetermined. 2. Small knee joint effusion. 3. Patellofemoral degenerative change. MRI may prove useful for further evaluation.   Electronically Signed   By: Marcello Moores  Register   On: 04/03/2013 17:54      MDM   1. Right knee pain   2.  Fracture of tibial spine, closed   3. Tendinitis of left knee      Crutches and knee immobilizer norco for pain Ice F/U with ortho as above.    Suzanne Napoleon, NP 04/03/13 1821  Suzanne Napoleon, NP 04/03/13 [phone]

## 2013-04-03 NOTE — ED Notes (Signed)
Has been having some right knee pain x 2-3 wks; was walking into work today when she "twisted" her right knee - now unable to bear weight on RLE.  Has applied ice and elevated RLE.

## 2013-04-03 NOTE — ED Notes (Signed)
Crackers provided.

## 2013-04-03 NOTE — Discharge Instructions (Signed)
Knee Pain °Knee pain can be a result of an injury or other medical conditions. Treatment will depend on the cause of your pain. °HOME CARE °· Only take medicine as told by your doctor. °· Keep a healthy weight. Being overweight can make the knee hurt more. °· Stretch before exercising or playing sports. °· If there is constant knee pain, change the way you exercise. Ask your doctor for advice. °· Make sure shoes fit well. Choose the right shoe for the sport or activity. °· Protect your knees. Wear kneepads if needed. °· Rest when you are tired. °GET HELP RIGHT AWAY IF:  °· Your knee pain does not stop. °· Your knee pain does not get better. °· Your knee joint feels hot to the touch. °· You have a fever. °MAKE SURE YOU:  °· Understand these instructions. °· Will watch this condition. °· Will get help right away if you are not doing well or get worse. °Document Released: 05/23/2008 Document Revised: 05/19/2011 Document Reviewed: 05/23/2008 °[employer]® Patient Information ©2014 [employer]. ° °Knee Bracing °Knee braces are supports to help stabilize and protect an injured or painful knee. They come in many different styles. They should support and protect the knee without increasing the chance of other injuries to yourself or others. It is important not to have a false sense of security when using a brace. Knee braces that help you to keep using your knee: °· Do not restore normal knee stability under high stress forces. °· May decrease some aspects of athletic performance. °Some of the different types of knee braces are: °· Prophylactic knee braces are designed to prevent or reduce the severity of knee injuries during sports that make injury to the knee more likely. °· Rehabilitative knee braces are designed to allow protected motion of: °· Injured knees. °· Knees that have been treated with or without surgery. °There is no evidence that the use of a supportive knee brace protects the graft following a successful  anterior cruciate ligament (ACL) reconstruction. However, braces are sometimes used to:  °· Protect injured ligaments. °· Control knee movement during the initial healing period. °They may be used as part of the treatment program for the various injured ligaments or cartilage of the knee including the: °· Anterior cruciate ligament. °· Medial collateral ligament. °· Medial or lateral cartilage (meniscus). °· Posterior cruciate ligament. °· Lateral collateral ligament. °Rehabilitative knee braces are most commonly used: °· During crutch-assisted walking right after injury. °· During crutch-assisted walking right after surgery to repair the cartilage and/or cruciate ligament injury. °· For a short period of time, 2-8 weeks, after the injury or surgery. °The value of a rehabilitative brace as opposed to a cast or splint includes the: °· Ability to adjust the brace for swelling. °· Ability to remove the brace for examinations, icing or showering. °· Ability to allow for movement in a controlled range of motion. °Functional knee braces give support to knees that have already been injured. They are designed to provide stability for the injured knee and provide protection after repair. Functional knee braces may not affect performance much. Lower extremity muscle strengthening, flexibility, and improvement in technique are more important than bracing in treating ligamentous knee injuries. Functional braces are not a substitute for rehabilitation or surgical procedures. °Unloader/offloader braces are designed to provide pain relief in arthritic knees. Patients with wear and tear arthritis from growing old or from an old cartilage injury (osteoarthritis) of the knee, and bow legged (varus) or knock   knee (valgus) deformities, often develop increased pain in the arthritic side due to increased loading. Unloader/offloader braces are made to reduce uneven loading in such knees. There is reduction in bowing out movement in bow  legged knees when the correct unloader brace is used. Patients with advanced osteoarthritis or severe varus or valgus alignment problems would not likely benefit from bracing. °Patellofemoral braces help the kneecap to move smoothly and well centered over the end of the femur in the knee.  °Most people who wear knee braces feel that they help. However, there is a lack of scientific evidence that knee braces are helpful at the level needed for athletic participation to prevent injury. In spite of this, athletes report an increase in knee stability, pain relief, performance improvement, and confidence during athletics when using a brace.  °Different knee problems require different knee braces: °· Your caregiver may suggest one kind of knee brace after knee surgery. °· A caregiver may choose another kind of knee brace for support instead of surgery for some types of torn ligaments. °· You may also need one for pain in the front of your knee that is not getting better with strengthening and flexibility exercises. °Get your caregiver's advice if you want to try a knee brace. The caregiver will advise you on where to get them and provide a prescription when it is needed to fashion and/or fit the brace. °Knee braces are the least important part of preventing knee injuries or getting better following injury. Stretching, strengthening and technique improvement are far more important in caring for and preventing knee injuries. When strengthening your knee, increase your activities a little at a time so as not to develop injuries from over use. Work out an exercise plan with your caregiver and/or physical therapist to get the best program for you. Do not let a knee brace become a crutch. °Always remember, there are no braces which support the knee as well as your original ligaments and cartilage you were born with. Conditioning, proper warm-up and stretching remain the most important parts of keeping your knees healthy. °HOW  TO USE A KNEE BRACE °· During sports, knee braces should be used as directed by your caregiver. °· Make sure that the hinges are where the knee bends. °· Straps, tapes, or hook-and-loop tapes should be fastened around your leg as instructed. °· You should check the placement of the brace during activities to make sure that it has not moved. Poorly positioned braces can hurt rather than help you. °· To work well, a knee brace should be worn during all activities that put you at risk of knee injury. °· Warm up properly before beginning athletic activities. °HOME CARE INSTRUCTIONS °· Knee braces often get damaged during normal use. Replace worn-out braces for maximum benefit. °· Clean regularly with soap and water. °· Inspect your brace often for wear and tear. °· Cover exposed metal to protect others from injury. °· Durable materials may cost more, but last longer. °SEEK IMMEDIATE MEDICAL CARE IF:  °· Your knee seems to be getting worse rather than better. °· You have increasing pain or swelling in the knee. °· You have problems caused by the knee brace. °· You have increased swelling or inflammation (redness or soreness) in your knee. °· Your knee becomes warm and more painful and you develop an unexplained temperature over 101° F (38.3° C). °MAKE SURE YOU:  °· Understand these instructions. °· Will watch your condition. °· Will get help right away if   you are not doing well or get worse. See your caregiver, physical therapist or orthopedic surgeon for additional information. Document Released: 05/17/2003 Document Revised: 05/19/2011 Document Reviewed: 08/23/2008 [facility] Patient Information 2014 [place], [place].  Document Released: 06/06/2010 Document Revised: 05/19/2011 Document Reviewed: 06/06/2010 [facility] Patient Information 2014 [place], [place].  Tibial Plateau Fracture with Rehab The tibial plateau is the top surface of the shinbone (tibia) and is part of the knee joint. A tibial plateau fracture is a  break that occurs in this portion of the shinbone. Tibial plateau fractures are fairly common. This injury is also known as a "bumper injury," because it often occurs when a person is hit by the bumper of a car.  SYMPTOMS   Severe pain at the fracture site, at the time of injury, which may continue over a period of time.  Tenderness, inflammation, and/or bruising (contusion) over the fracture site.  Decreased knee function.  Inability to stand or walk on the injured leg.  Visible deformity, if the bone fragments are not properly aligned (displaced fracture).  Signs of vascular damage: numbness and coldness below the injury site. CAUSES  Tibial plateau fractures occur when a force is placed on the bone that is greater than it can withstand. Common causes of injury include:  Direct hit (trauma) to the tibial plateau.  Indirect trauma, such as a twisting or bending injury. RISK INCREASES WITH:  Contact sports (football, rugby, lacrosse).  Motor sports.  Bone disease (osteoporosis, bone tumor).  Metabolism disorders, hormone problems, nutritional deficiency, or eating disorders (anorexia, bulimia).  Poor strength and flexibility. PREVENTION  Warm up and stretch properly before activity.  Maintain physical fitness:  Strength, flexibility, and endurance.  Cardiovascular fitness.  Wear properly fitted and padded protective equipment (i.e. shin guards for soccer). PROGNOSIS  If treated properly, tibial plateau fractures usually heal.  RELATED COMPLICATIONS   Fracture fails to heal (nonunion).  Fracture heals in a poor position (malunion).  Bleeding within the leg that leads to the squeezing of nerves inside a closed space (compartment syndrome), causing injury to the nerves or vessels in the leg.  Shortening of the injured bones.  Shinbone growth stopping in children.  Infection, if the skin is broken over the fracture site (open fracture).  Arthritis of the  knee.  Longer healing time, if not properly treated or re-injured.  Stiff knee.  Risks of surgery: infection, bleeding, nerve damage, or damage to surrounding tissues. TREATMENT Treatment first involves the use of ice and medicine to reduce pain and inflammation. If the bone fragments are out of alignment (displaced fracture), immediate realignment of the bone (reduction) by a trained professional is required. Fractures that cannot be realigned by hand, or are open (bones poking through the skin), may require surgery to hold the fracture in place with screws, pins, and plates. Once the bone is aligned, the knee should be restrained to allow for healing. After restraint, it is important to perform strengthening and stretching exercises to help regain strength and a full range of motion. These exercises may be completed at home or with a therapist.  MEDICATION   If pain medicine is needed, nonsteroidal anti-inflammatory medicines (aspirin and ibuprofen), or other minor pain relievers (acetaminophen), are often recommended.  Do not take pain medicine for 7 days before surgery.  Prescription pain relievers may be given if your caregiver thinks they are needed. Use only as directed and only as much as you need. COLD THERAPY  Cold treatment (icing) relieves pain and reduces  inflammation. Cold treatment should be applied for 10 to 15 minutes every 2 to 3 hours, and immediately after activity that aggravates your symptoms. Use ice packs or an ice massage. SEEK MEDICAL CARE IF:  Treatment does not seem to help, or the condition gets worse.  Any medicines produce negative side effects.  Any complications from surgery occur:  Pain, numbness, or coldness in the affected leg.  Discoloration under the toenails (blue or gray) of the affected leg.  Signs of infection (fever, pain, inflammation, redness, or persistent bleeding). EXERCISES RANGE OF MOTION (ROM) AND STRETCHING EXERCISES - Tibial Plateau  Fracture These exercises may help you when beginning to rehabilitate your injury. Your symptoms may go away with or without further involvement from your physician, physical therapist or athletic trainer. While completing these exercises, remember:   Restoring tissue flexibility helps normal motion to return to the joints. This allows healthier, less painful movement and activity.  An effective stretch should be held for at least 30 seconds.  A stretch should never be painful. You should only feel a gentle lengthening or release in the stretched tissue. RANGE OF MOTION - Knee Flexion and Extension, Active-Assisted  Sit on the edge of a table or chair with your thighs firmly supported. It may help to place a folded towel under the end of your right / left thigh.  Flexion (bending): Place the ankle of your healthy leg on top of the other ankle. Use your healthy leg to gently bend your right / left knee until you feel a mild tension across the top of your knee.  Hold for __________ seconds.  Extension (straightening): Switch your ankles so your right / left leg is on top. Use your healthy leg to straighten your right / left knee until you feel a mild tension on the backside of your knee.  Hold for __________ seconds. Repeat __________ times. Complete __________ times per day. RANGE OF MOTION - Knee Flexion, Active  Lie on your back with both knees straight. (If this causes back discomfort, bend the knee of your healthy leg, placing your foot flat on the floor.)  Slowly slide your right / left heel back toward your buttocks until you feel a gentle stretch in the front of your knee or thigh.  Hold for __________ seconds. Slowly slide your heel back to the starting position. Repeat __________ times. Complete this exercise __________ times per day.  STRETCH - Knee Flexion, Supine  Lie on the floor with your right / left heel and foot lightly touching the wall (place both feet on the wall if  you do not use a door frame).  Without using any effort, allow gravity to slide your foot down the wall slowly, until you feel a gentle stretch in the front of your right / left knee.  Hold this stretch for __________ seconds. Then, return the leg to the starting position, using your healthy leg for help, if needed. Repeat __________ times. Complete this stretch __________ times per day.  STRETCH  Hamstrings, Supine   Lie on your back. Loop a belt or towel over the ball of your right / left foot.  Straighten your right / left knee and slowly pull on the belt to raise your leg. Do not allow the right / left knee to bend. Keep your opposite leg flat on the floor.  Raise the leg until you feel a gentle stretch behind your right / left knee or thigh. Hold this position for __________ seconds. Repeat  __________ times. Complete this stretch __________ times per day.  STRETCH - Hamstrings, Doorway  Lie on your back with your right / left leg extended and resting on the wall, and your opposite leg flat on the ground, through the door. To start, position your bottom farther away from the wall than the picture shows.  Keep your right / left knee straight. If you feel a stretch behind your knee or thigh, hold this position for __________ seconds.  If you do not feel a stretch, scoot your bottom closer to the door, and hold for __________ seconds. Repeat __________ times. Complete this stretch __________ times per day.  STRETCH - Knee Extension Sitting  Sit with your right / left leg or heel propped on another chair, coffee table, or foot stool.  Allow your leg muscles to relax, letting gravity straighten out your knee.*  You should feel a stretch behind your right / left knee. Hold this position for __________ seconds. Repeat __________ times. Complete this stretch __________ times per day.  *Your physician, physical therapist or athletic trainer may instruct you to place a __________ weight on  your thigh, just above your kneecap, to deepen the stretch.  STRETCH - Knee Extension, Prone  Lie on your stomach on a firm surface, such as a bed or countertop. Place your right / left knee and leg just beyond the edge of the surface. You may wish to place a towel under the far end of your right / left thigh for comfort.  Relax your leg muscles and allow gravity to straighten your knee. Your caregiver may advise you to add an ankle weight, if more resistance is helpful for you.  You should feel a stretch in the back of your right / left knee. Hold this position for __________ seconds. Repeat __________ times. Complete this __________ times per day. STRETCH - Quadriceps, Prone  Lie on your stomach on a firm surface, such as a bed or padded floor.  Bend your right / left knee and grasp your ankle. If you are not able to reach your ankle or pant leg, use a belt around your foot to lengthen your reach.  Gently pull your heel toward your buttocks. Your knee should not slide out to the side. You should feel a stretch in the front of your thigh and knee.  Hold this position for __________ seconds. Repeat __________ times. Complete this stretch __________ times per day.  STRENGTHENING EXERCISES - Tibial Plateau Fracture These exercises may help you when beginning to recover from your injury. Your symptoms may go away with or without further involvement from your physician, physical therapist or athletic trainer. While completing these exercises, remember:   Muscles can gain both the endurance and the strength needed for everyday activities through controlled exercises.  Complete these exercises as instructed by your physician, physical therapist or athletic trainer. Increase the resistance and repetitions only as guided. STRENGTH - Quadriceps, Isometrics  Lie on your back with your right / left leg extended and your opposite knee bent.  Gradually tense the muscles in the front of your right /  left thigh. You should see either your knee cap slide up toward your hip or increased dimpling just above the knee. This motion will push the back of the knee down toward the floor, mat, or bed on which you are lying.  Hold the muscle as tight as you can, without increasing your pain, for __________ seconds.  Relax the muscles slowly and completely between  each repetition. Repeat __________ times. Complete this exercise __________ times per day.  STRENGTH - Quadriceps, Short Arcs  Lie on your back. Place a __________ inch towel roll under your knee, so that the knee bends slightly.  Raise only your lower leg, by tightening the muscles in the front of your thigh. Do not allow your thigh to rise.  Hold this position for __________ seconds. Repeat __________ times. Complete this exercise __________ times per day.  OPTIONAL ANKLE WEIGHTS: Begin with ____________________, but DO NOT exceed ____________________. Increase in 1 pound/ 0.5 kilogram increments. STRENGTH - Quadriceps, Straight Leg Raises Quality counts! Watch for signs that the quadriceps muscle is working, to make sure you are strengthening the correct muscles and not "cheating" by substituting with healthier muscles.  Lay on your back with your right / left leg extended and your opposite knee bent.  Tense the muscles in the front of your right / left thigh. You should see your knee cap slide up, or see increased dimpling just above the knee. Your thigh may even shake a bit.  Tighten these muscles even more, and raise your leg 4 to 6 inches off the floor. Hold for __________ seconds.  Keeping these muscles tense, lower your leg.  Relax the muscles slowly and completely between each repetition. Repeat __________ times. Complete this exercise __________ times per day.  STRENGTH - Hamstring, Isometrics   Lie on your back, on a firm surface.  Bend your right / left knee approximately __________ degrees.  Dig your heel into the  surface, as if you are trying to pull it toward your buttocks. Tighten the muscles in the back of your thighs to "dig" as hard as you can, without increasing any pain.  Hold this position for __________ seconds.  Release the tension gradually and allow your muscle to completely relax for __________ seconds between each exercise. Repeat __________ times. Complete this exercise __________ times per day.  STRENGTH - Hamstring, Curls  Lay on your stomach, with your legs extended. (If you lay on a bed, your feet may hang over the edge.)  Tighten the muscles in the back of your thigh to bend your right / left knee up to 90 degrees. Keep your hips flat on the bed or floor.  Hold this position for __________ seconds.  Slowly lower your leg back to the starting position. Repeat __________ times. Complete this exercise __________ times per day.  OPTIONAL ANKLE WEIGHTS: Begin with ____________________, but DO NOT exceed ____________________. Increase in __________ increments.  STRENGTH - Hip Abductors, Straight Leg Raises Be aware of your form throughout the entire exercise, so that you exercise the correct muscles. Poor form means that you are not strengthening the correct muscles.  Lie on your side, so that your head, shoulders, knee and hip line up. You may bend your lower knee, to help maintain your balance. Your right / left leg should be on top.  Roll your hips slightly forward, so that your hips are stacked directly over each other, and your right / left knee is facing forward.  Lift your top leg up, 4-6 inches, leading with your heel. Be sure that your foot does not drift forward or that your knee does not roll toward the ceiling.  Hold this position for __________ seconds. You should feel the muscles in your outer hip lifting (you may not notice this until your leg begins to tire).  Slowly lower your leg to the starting position. Allow the muscles to fully  relax before starting the next  repetition. Repeat __________ times. Complete this exercise __________ times per day.  STRENGTH - Hip Extensors, Bridge   Lie on your back, on a firm surface. Bend your knees and place your feet flat on the floor.  Tighten your buttocks muscles and lift your bottom off the floor, until your trunk is level with your thighs. You should feel the muscles in your buttocks and the back of your thighs working. If you do not feel these muscles, slide your feet 1-2 inches further away from your buttocks.  Hold this position for __________ seconds.  Slowly lower your hips to the starting position, and allow your buttock muscles to relax completely before starting the next repetition.  If this exercise is too easy, you may cross your arms over your chest. Repeat __________ times. Complete this exercise __________ times per day.  STRENGTH  Quadriceps, Squats  Stand in a door frame, so that your feet and knees are in line with the frame.  Use your hands for balance, not support, on the frame.  Slowly lower your weight, bending at the hips and knees. Keep your lower legs (below the knee) upright, so that they are parallel with the door frame. Squat only within a range that does not increase your knee pain. Never let your hips drop below your knees.  Slowly return to upright, pushing with your legs, not pulling with your hands. Repeat __________ times. Complete this exercise __________ times per day.  Document Released: 02/24/2005 Document Revised: 05/19/2011 Document Reviewed: 06/08/2008 [facility] Patient Information 2014 [place], [place].

## 2013-04-04 NOTE — ED Provider Notes (Signed)
Medical screening examination/treatment/procedure(s) were performed by resident physician or non-physician practitioner and as supervising physician I was immediately available for consultation/collaboration.   Pauline Good MD.   Billy Fischer, MD 04/04/13 [phone]

## 2014-03-10 HISTORY — PX: ABDOMINAL HYSTERECTOMY: SHX81

## 2015-03-15 ENCOUNTER — Ambulatory Visit: Payer: Self-pay | Admitting: Gynecologic Oncology

## 2015-03-19 ENCOUNTER — Ambulatory Visit: Payer: BC Managed Care – PPO | Attending: Gynecologic Oncology | Admitting: Gynecologic Oncology

## 2015-03-19 ENCOUNTER — Encounter: Payer: Self-pay | Admitting: Gynecologic Oncology

## 2015-03-19 VITALS — BP 146/72 | HR 78 | Temp 98.1°F | Resp 18 | Ht 66.0 in | Wt 219.0 lb

## 2015-03-19 DIAGNOSIS — C541 Malignant neoplasm of endometrium: Secondary | ICD-10-CM | POA: Insufficient documentation

## 2015-03-19 DIAGNOSIS — Z6835 Body mass index (BMI) 35.0-35.9, adult: Secondary | ICD-10-CM | POA: Diagnosis not present

## 2015-03-19 DIAGNOSIS — E669 Obesity, unspecified: Secondary | ICD-10-CM

## 2015-03-19 NOTE — Progress Notes (Signed)
Consult Note: Gyn-Onc  Consult was requested by Dr. Matthew Saras for the evaluation of Janean Sark 50 y.o. female  CC:  Chief Complaint  Patient presents with  . endometrial cancer    Assessment/Plan:  Ms. ALEXCIA BROMM  is a 50 y.o.  year old with FIGO grade 1 endometrial cancer. She is obese with a BMI of 35kg/m2.   A detailed discussion was held with the patient and her family with regard to to her endometrial cancer diagnosis. We discussed the standard management options for uterine cancer which includes surgery followed possibly by adjuvant therapy depending on the results of surgery. The options for surgical management include a hysterectomy and removal of the tubes and ovaries possibly with removal of pelvic and para-aortic lymph nodes. A minimally invasive approach including a robotic hysterectomy or laparoscopic hysterectomy have benefits including shorter hospital stay, recovery time and better wound healing. The alternative approach is an open hysterectomy. The patient has been counseled about these surgical options and the risks of surgery in general including infection, bleeding, damage to surrounding structures (including bowel, bladder, ureters, nerves or vessels), and the postoperative risks of PE/ DVT, and lymphedema. I extensively reviewed the additional risks of robotic hysterectomy including possible need for conversion to open laparotomy.  I discussed positioning during surgery of trendelenberg and risks of minor facial swelling and care we take in preoperative positioning.  After counseling and consideration of her options, she desires to proceed with robotic assisted total hysterectomy, BSO and sentinel lymph node biopsy.   She will be seen by anesthesia for preoperative clearance and discussion of postoperative pain management.  She was given the opportunity to ask questions, which were answered to her satisfaction, and she is agreement with the above mentioned  plan of care.  I discussed that due to her larger uterine size with fibroids, and her nulliparity, specimen delivery may not be possible vaginally, in which case we would perform a minilaparotomy for intact specimen removal.   HPI: The patient is a 50 year old G0 was seen in consultation at the request of Dr. Matthew Saras for grade 1 endometrial cancer. The patient reports a long-standing history of many years of abnormal uterine bleeding and irregular menses. She reported this to Dr. Matthew Saras who performed an ultrasound scan in July 2016. This revealed a uterus measuring 11 x 6.6 x 6.4 cm containing multiple fibroids including 4.4 cm subserosal fibroid to semi-intramural fibroid and possibly 3 cm fibroid within the endometrial cavity. No adnexal masses were identified. As follow-up to she then underwent a D&C on 02/21/2015 which revealed well-differentiated endometrioid adenocarcinoma FIGO grade 1. Of note the patient had a Pap smear in July 2016 which showed ASCUS with negative high-risk HPV.  She is otherwise fairly healthy. She's had 2 prior abdominal surgeries for ovarian cysts one in the 80s that was fired laparotomy any other laparoscopically. She has prediabetes is on no medication for this. She is a maternal history for breast cancer.   Current Meds:  Outpatient Encounter Prescriptions as of 03/19/2015  Medication Sig  . Ascorbic Acid (VITAMIN C) 100 MG tablet Take 500 mg by mouth daily.   Marland Kitchen b complex vitamins tablet Take 1 tablet by mouth daily.  Marland Kitchen BIOTIN PO Take 1,000 mcg by mouth every morning.  . Multiple Vitamin (MULTIVITAMIN) tablet Take 1 tablet by mouth daily.  . Nutritional Supplements (SILICA PO) Take by mouth every morning.  . [DISCONTINUED] HYDROcodone-acetaminophen (NORCO/VICODIN) 5-325 MG per tablet Take 1 tablet by  mouth every 4 (four) hours as needed.  . [DISCONTINUED] metroNIDAZOLE (FLAGYL) 500 MG tablet Take 1 tablet twice a day for 7 days.  . [DISCONTINUED] naproxen (NAPROSYN)  375 MG tablet Take 1 tablet (375 mg total) by mouth 2 (two) times daily.   No facility-administered encounter medications on file as of 03/19/2015.    Allergy: No Known Allergies  Social Hx:   Social History   Social History  . Marital Status: Widowed    Spouse Name: N/A  . Number of Children: N/A  . Years of Education: N/A   Occupational History  . Not on file.   Social History Main Topics  . Smoking status: Never Smoker   . Smokeless tobacco: Not on file  . Alcohol Use: 0.0 oz/week     Comment: OCCASIONAL  . Drug Use: No  . Sexual Activity: Not on file   Other Topics Concern  . Not on file   Social History Narrative    Past Surgical Hx:  Past Surgical History  Procedure Laterality Date  . Ovarian tumor removal      Past Medical Hx:  Past Medical History  Diagnosis Date  . Ovarian tumor (benign)     Past Gynecological History:  G0  No LMP recorded.  Family Hx:  Family History  Problem Relation Age of Onset  . Arthritis Mother     Review of Systems:  Constitutional  Feels well,    ENT Normal appearing ears and nares bilaterally Skin/Breast  No rash, sores, jaundice, itching, dryness Cardiovascular  No chest pain, shortness of breath, or edema  Pulmonary  No cough or wheeze.  Gastro Intestinal  No nausea, vomitting, or diarrhoea. No bright red blood per rectum, no abdominal pain, change in bowel movement, or constipation.  Genito Urinary  No frequency, urgency, dysuria, + abnormal uterine bleeding Musculo Skeletal  No myalgia, arthralgia, joint swelling or pain  Neurologic  No weakness, numbness, change in gait,  Psychology  No depression, anxiety, insomnia.   Vitals:  Blood pressure 146/72, pulse 78, temperature 98.1 F (36.7 C), temperature source Oral, resp. rate 18, height 5\' 6"  (1.676 m), weight 219 lb (99.338 kg), SpO2 100 %.  Physical Exam: WD in NAD Neck  Supple NROM, without any enlargements.  Lymph Node Survey No cervical  supraclavicular or inguinal adenopathy Cardiovascular  Pulse normal rate, regularity and rhythm. S1 and S2 normal.  Lungs  Clear to auscultation bilateraly, without wheezes/crackles/rhonchi. Good air movement.  Skin  No rash/lesions/breakdown  Psychiatry  Alert and oriented to person, place, and time  Abdomen  Normoactive bowel sounds, abdomen soft, non-tender and obese without evidence of hernia.  Back No CVA tenderness Genito Urinary  Vulva/vagina: Normal external female genitalia.  No lesions. No discharge or bleeding.  Bladder/urethra:  No lesions or masses, well supported bladder  Vagina: narrow, no lesions  Cervix: Normal appearing, no lesions.  Uterus: bulky, 12cm, mobile, no parametrial involvement or nodularity.  Adnexa: no palpable masses. Rectal  Good tone, no masses no cul de sac nodularity.  Extremities  No bilateral cyanosis, clubbing or edema.   Donaciano Eva, MD  03/19/2015, 5:00 PM

## 2015-03-19 NOTE — Patient Instructions (Signed)
Preparing for your Surgery  Plan for surgery on April 03, 2015 with Dr. Everitt Amber.  You will be scheduled for a robotic assisted total hysterectomy, bilateral salpingo-oophorectomy, sentinel lymph node biopsy.  Pre-operative Testing -You will receive a phone call from presurgical testing at [facility] to arrange for a pre-operative testing appointment before your surgery.  This appointment normally occurs one to two weeks before your scheduled surgery.   -Bring your insurance card, copy of an advanced directive if applicable, medication list  -At that visit, you will be asked to sign a consent for a possible blood transfusion in case a transfusion becomes necessary during surgery.  The need for a blood transfusion is rare but having consent is a necessary part of your care.     -You should not be taking blood thinners or aspirin at least ten days prior to surgery unless instructed by your surgeon.  Day Before Surgery at [place] will be asked to take in only clear liquids the day before surgery.  Examples of clear liquids include broths, jello, and clear juices.  Avoid carbonated beverages.  You will be advised to have nothing to eat or drink after midnight the evening before.    Your role in recovery Your role is to become active as soon as directed by your doctor, while still giving yourself time to heal.  Rest when you feel tired. You will be asked to do the following in order to speed your recovery:  - Cough and breathe deeply. This helps toclear and expand your lungs and can prevent pneumonia. You may be given a spirometer to practice deep breathing. A staff member will show you how to use the spirometer. - Do mild physical activity. Walking or moving your legs help your circulation and body functions return to normal. A staff member will help you when you try to walk and will provide you with simple exercises. Do not try to get up or walk alone the first time. -  Actively manage your pain. Managing your pain lets you move in comfort. We will ask you to rate your pain on a scale of zero to 10. It is your responsibility to tell your doctor or nurse where and how much you hurt so your pain can be treated.  Special Considerations -If you are diabetic, you may be placed on insulin after surgery to have closer control over your blood sugars to promote healing and recovery.  This does not mean that you will be discharged on insulin.  If applicable, your oral antidiabetics will be resumed when you are tolerating a solid diet.  -Your final pathology results from surgery should be available by the Friday after surgery and the results will be relayed to you when available.  Blood Transfusion Information WHAT IS A BLOOD TRANSFUSION? A transfusion is the replacement of blood or some of its parts. Blood is made up of multiple cells which provide different functions.  Red blood cells carry oxygen and are used for blood loss replacement.  White blood cells fight against infection.  Platelets control bleeding.  Plasma helps clot blood.  Other blood products are available for specialized needs, such as hemophilia or other clotting disorders. BEFORE THE TRANSFUSION  Who gives blood for transfusions?   You may be able to donate blood to be used at a later date on yourself (autologous donation).  Relatives can be asked to donate blood. This is generally not any safer than if you have received blood  from a stranger. The same precautions are taken to ensure safety when a relative's blood is donated.  Healthy volunteers who are fully evaluated to make sure their blood is safe. This is blood bank blood. Transfusion therapy is the safest it has ever been in the practice of medicine. Before blood is taken from a donor, a complete history is taken to make sure that person has no history of diseases nor engages in risky social behavior (examples are intravenous drug use or  sexual activity with multiple partners). The donor's travel history is screened to minimize risk of transmitting infections, such as malaria. The donated blood is tested for signs of infectious diseases, such as HIV and hepatitis. The blood is then tested to be sure it is compatible with you in order to minimize the chance of a transfusion reaction. If you or a relative donates blood, this is often done in anticipation of surgery and is not appropriate for emergency situations. It takes many days to process the donated blood. RISKS AND COMPLICATIONS Although transfusion therapy is very safe and saves many lives, the main dangers of transfusion include:   Getting an infectious disease.  Developing a transfusion reaction. This is an allergic reaction to something in the blood you were given. Every precaution is taken to prevent this. The decision to have a blood transfusion has been considered carefully by your caregiver before blood is given. Blood is not given unless the benefits outweigh the risks.

## 2015-03-23 ENCOUNTER — Ambulatory Visit: Payer: Self-pay | Admitting: Gynecology

## 2015-03-28 NOTE — Patient Instructions (Addendum)
Suzanne [name]  03/28/2015   Your procedure is scheduled on: 04-03-15  Tuesday  Report to [facility] Main  Entrance take [facility]  elevators to 3rd floor to  [place] at 0930 AM.  Call this number if you have problems the morning of surgery [phone]   Remember: ONLY 1 PERSON MAY GO WITH YOU TO SHORT STAY TO GET  READY MORNING OF [place].  Do not eat food or drink liquids :After Midnight., Exception Clear Liquids 24 hour day before, may have 12 midnight to 0600 AM day of, then nothing.     Take these medicines the morning of surgery with A SIP OF WATER: NONE DO NOT TAKE ANY DIABETIC MEDICATIONS DAY OF YOUR SURGERY                               You may not have any metal on your body including hair pins and              piercings  Do not wear jewelry, make-up, lotions, powders or perfumes, deodorant             Do not wear nail polish.  Do not shave  48 hours prior to surgery.              Men may shave face and neck.   Do not bring valuables to the hospital. [place].  Contacts, dentures or bridgework may not be worn into surgery.  Leave suitcase in the car. After surgery it may be brought to your room.     Patients discharged the day of surgery will not be allowed to drive home.  Name and phone number of your driver: will arrange  Special Instructions: N/A              Please read over the following fact sheets you were given: _____________________________________________________________________             [facility] - Preparing for Surgery Before surgery, you can play an important role.  Because skin is not sterile, your skin needs to be as free of germs as possible.  You can reduce the number of germs on your skin by washing with CHG (chlorahexidine gluconate) soap before surgery.  CHG is an antiseptic cleaner which kills germs and bonds with the skin to continue killing germs even  after washing. Please DO NOT use if you have an allergy to CHG or antibacterial soaps.  If your skin becomes reddened/irritated stop using the CHG and inform your nurse when you arrive at Short Stay. Do not shave (including legs and underarms) for at least 48 hours prior to the first CHG shower.  You may shave your face/neck. Please follow these instructions carefully:  1.  Shower with CHG Soap the night before surgery and the  morning of Surgery.  2.  If you choose to wash your hair, wash your hair first as usual with your  normal  shampoo.  3.  After you shampoo, rinse your hair and body thoroughly to remove the  shampoo.                           4.  Use  CHG as you would any other liquid soap.  You can apply chg directly  to the skin and wash                       Gently with a scrungie or clean washcloth.  5.  Apply the CHG Soap to your body ONLY FROM THE NECK DOWN.   Do not use on face/ open                           Wound or open sores. Avoid contact with eyes, ears mouth and genitals (private parts).                       Wash face,  Genitals (private parts) with your normal soap.             6.  Wash thoroughly, paying special attention to the area where your surgery  will be performed.  7.  Thoroughly rinse your body with warm water from the neck down.  8.  DO NOT shower/wash with your normal soap after using and rinsing off  the CHG Soap.                9.  Pat yourself dry with a clean towel.            10.  Wear clean pajamas.            11.  Place clean sheets on your bed the night of your first shower and do not  sleep with pets. Day of Surgery : Do not apply any lotions/deodorants the morning of surgery.  Please wear clean clothes to the hospital/surgery center.  FAILURE TO FOLLOW THESE INSTRUCTIONS MAY RESULT IN THE CANCELLATION OF YOUR SURGERY PATIENT SIGNATURE_________________________________  NURSE  SIGNATURE__________________________________  ________________________________________________________________________   Adam Phenix  An incentive spirometer is a tool that can help keep your lungs clear and active. This tool measures how well you are filling your lungs with each breath. Taking long deep breaths may help reverse or decrease the chance of developing breathing (pulmonary) problems (especially infection) following:  A long period of time when you are unable to move or be active. BEFORE THE PROCEDURE   If the spirometer includes an indicator to show your best effort, your nurse or respiratory therapist will set it to a desired goal.  If possible, sit up straight or lean slightly forward. Try not to slouch.  Hold the incentive spirometer in an upright position. INSTRUCTIONS FOR USE   Sit on the edge of your bed if possible, or sit up as far as you can in bed or on a chair.  Hold the incentive spirometer in an upright position.  Breathe out normally.  Place the mouthpiece in your mouth and seal your lips tightly around it.  Breathe in slowly and as deeply as possible, raising the piston or the ball toward the top of the column.  Hold your breath for 3-5 seconds or for as long as possible. Allow the piston or ball to fall to the bottom of the column.  Remove the mouthpiece from your mouth and breathe out normally.  Rest for a few seconds and repeat Steps 1 through 7 at least 10 times every 1-2 hours when you are awake. Take your time and take a few normal breaths between deep breaths.  The spirometer may include an indicator to show your best effort.  Use the indicator as a goal to work toward during each repetition.  After each set of 10 deep breaths, practice coughing to be sure your lungs are clear. If you have an incision (the cut made at the time of surgery), support your incision when coughing by placing a pillow or rolled up towels firmly against it. Once  you are able to get out of bed, walk around indoors and cough well. You may stop using the incentive spirometer when instructed by your caregiver.  RISKS AND COMPLICATIONS  Take your time so you do not get dizzy or light-headed.  If you are in pain, you may need to take or ask for pain medication before doing incentive spirometry. It is harder to take a deep breath if you are having pain. AFTER USE  Rest and breathe slowly and easily.  It can be helpful to keep track of a log of your progress. Your caregiver can provide you with a simple table to help with this. If you are using the spirometer at home, follow these instructions: [place] IF:   You are having difficultly using the spirometer.  You have trouble using the spirometer as often as instructed.  Your pain medication is not giving enough relief while using the spirometer.  You develop fever of 100.5 F (38.1 C) or higher. SEEK IMMEDIATE MEDICAL CARE IF:   You cough up bloody sputum that had not been present before.  You develop fever of 102 F (38.9 C) or greater.  You develop worsening pain at or near the incision site. MAKE SURE YOU:   Understand these instructions.  Will watch your condition.  Will get help right away if you are not doing well or get worse. Document Released: 07/07/2006 Document Revised: 05/19/2011 Document Reviewed: 09/07/2006 Exit[employer] Patient Information 2014 Exit[employer], [place].   ________________________________________________________________________  WHAT IS A BLOOD TRANSFUSION? Blood Transfusion Information  A transfusion is the replacement of blood or some of its parts. Blood is made up of multiple cells which provide different functions.  Red blood cells carry oxygen and are used for blood loss replacement.  White blood cells fight against infection.  Platelets control bleeding.  Plasma helps clot blood.  Other blood products are available for specialized needs, such as  hemophilia or other clotting disorders. BEFORE THE TRANSFUSION  Who gives blood for transfusions?   Healthy volunteers who are fully evaluated to make sure their blood is safe. This is blood bank blood. Transfusion therapy is the safest it has ever been in the practice of medicine. Before blood is taken from a donor, a complete history is taken to make sure that person has no history of diseases nor engages in risky social behavior (examples are intravenous drug use or sexual activity with multiple partners). The donor's travel history is screened to minimize risk of transmitting infections, such as malaria. The donated blood is tested for signs of infectious diseases, such as HIV and hepatitis. The blood is then tested to be sure it is compatible with you in order to minimize the chance of a transfusion reaction. If you or a relative donates blood, this is often done in anticipation of surgery and is not appropriate for emergency situations. It takes many days to process the donated blood. RISKS AND COMPLICATIONS Although transfusion therapy is very safe and saves many lives, the main dangers of transfusion include:   Getting an infectious disease.  Developing a transfusion reaction. This is an allergic reaction to something in the  blood you were given. Every precaution is taken to prevent this. The decision to have a blood transfusion has been considered carefully by your caregiver before blood is given. Blood is not given unless the benefits outweigh the risks. AFTER THE TRANSFUSION  Right after receiving a blood transfusion, you will usually feel much better and more energetic. This is especially true if your red blood cells have gotten low (anemic). The transfusion raises the level of the red blood cells which carry oxygen, and this usually causes an energy increase.  The nurse administering the transfusion will monitor you carefully for complications. HOME CARE INSTRUCTIONS  No special  instructions are needed after a transfusion. You may find your energy is better. Speak with your caregiver about any limitations on activity for underlying diseases you may have. SEEK MEDICAL CARE IF:   Your condition is not improving after your transfusion.  You develop redness or irritation at the intravenous (IV) site. SEEK IMMEDIATE MEDICAL CARE IF:  Any of the following symptoms occur over the next 12 hours:  Shaking chills.  You have a temperature by mouth above 102 F (38.9 C), not controlled by medicine.  Chest, back, or muscle pain.  People around you feel you are not acting correctly or are confused.  Shortness of breath or difficulty breathing.  Dizziness and fainting.  You get a rash or develop hives.  You have a decrease in urine output.  Your urine turns a dark color or changes to pink, red, or brown. Any of the following symptoms occur over the next 10 days:  You have a temperature by mouth above 102 F (38.9 C), not controlled by medicine.  Shortness of breath.  Weakness after normal activity.  The white part of the eye turns yellow (jaundice).  You have a decrease in the amount of urine or are urinating less often.  Your urine turns a dark color or changes to pink, red, or brown. Document Released: 02/22/2000 Document Revised: 05/19/2011 Document Reviewed: 10/11/2007 [facility] Patient Information 2014 [place], [place].  _______________________________________________________________________

## 2015-03-29 ENCOUNTER — Encounter (HOSPITAL_COMMUNITY)
Admission: RE | Admit: 2015-03-29 | Discharge: 2015-03-29 | Disposition: A | Discharge status: Home / Self Care | Payer: BC Managed Care – PPO | Source: Ambulatory Visit | Attending: Gynecologic Oncology | Admitting: Gynecologic Oncology

## 2015-03-29 ENCOUNTER — Ambulatory Visit (HOSPITAL_COMMUNITY)
Admission: RE | Admit: 2015-03-29 | Discharge: 2015-03-29 | Disposition: A | Discharge status: Home / Self Care | Payer: BC Managed Care – PPO | Source: Ambulatory Visit | Attending: Gynecologic Oncology | Admitting: Gynecologic Oncology

## 2015-03-29 ENCOUNTER — Encounter (HOSPITAL_COMMUNITY): Payer: Self-pay

## 2015-03-29 DIAGNOSIS — R0602 Shortness of breath: Secondary | ICD-10-CM | POA: Insufficient documentation

## 2015-03-29 DIAGNOSIS — C541 Malignant neoplasm of endometrium: Secondary | ICD-10-CM | POA: Insufficient documentation

## 2015-03-29 DIAGNOSIS — Z01818 Encounter for other preprocedural examination: Secondary | ICD-10-CM | POA: Insufficient documentation

## 2015-03-29 HISTORY — DX: Malignant (primary) neoplasm, unspecified: C80.1

## 2015-03-29 HISTORY — DX: Reserved for inherently not codable concepts without codable children: IMO0001

## 2015-03-29 HISTORY — DX: Anemia, unspecified: D64.9

## 2015-03-29 LAB — CBC WITH DIFFERENTIAL/PLATELET
Basophils Absolute: 0 10*3/uL (ref 0.0–0.1)
Basophils Relative: 0 %
EOS PCT: 2 %
Eosinophils Absolute: 0.1 10*3/uL (ref 0.0–0.7)
HCT: 38.4 % (ref 36.0–46.0)
Hemoglobin: 12.5 g/dL (ref 12.0–15.0)
LYMPHS ABS: 1.7 10*3/uL (ref 0.7–4.0)
LYMPHS PCT: 33 %
MCH: 30.6 pg (ref 26.0–34.0)
MCHC: 32.6 g/dL (ref 30.0–36.0)
MCV: 93.9 fL (ref 78.0–100.0)
MONO ABS: 0.3 10*3/uL (ref 0.1–1.0)
Monocytes Relative: 5 %
Neutro Abs: 3.2 10*3/uL (ref 1.7–7.7)
Neutrophils Relative %: 60 %
PLATELETS: 281 10*3/uL (ref 150–400)
RBC: 4.09 MIL/uL (ref 3.87–5.11)
RDW: 13.2 % (ref 11.5–15.5)
WBC: 5.3 10*3/uL (ref 4.0–10.5)

## 2015-03-29 LAB — URINALYSIS, ROUTINE W REFLEX MICROSCOPIC
BILIRUBIN URINE: NEGATIVE
GLUCOSE, UA: NEGATIVE mg/dL
HGB URINE DIPSTICK: NEGATIVE
Ketones, ur: NEGATIVE mg/dL
Nitrite: NEGATIVE
PH: 5 (ref 5.0–8.0)
Protein, ur: NEGATIVE mg/dL
SPECIFIC GRAVITY, URINE: 1.022 (ref 1.005–1.030)

## 2015-03-29 LAB — COMPREHENSIVE METABOLIC PANEL
ALT: 20 U/L (ref 14–54)
AST: 26 U/L (ref 15–41)
Albumin: 4.3 g/dL (ref 3.5–5.0)
Alkaline Phosphatase: 56 U/L (ref 38–126)
Anion gap: 9 (ref 5–15)
BUN: 15 mg/dL (ref 6–20)
CALCIUM: 9.2 mg/dL (ref 8.9–10.3)
CHLORIDE: 104 mmol/L (ref 101–111)
CO2: 26 mmol/L (ref 22–32)
CREATININE: 0.83 mg/dL (ref 0.44–1.00)
Glucose, Bld: 107 mg/dL — ABNORMAL HIGH (ref 65–99)
Potassium: 4.4 mmol/L (ref 3.5–5.1)
Sodium: 139 mmol/L (ref 135–145)
TOTAL PROTEIN: 7.7 g/dL (ref 6.5–8.1)
Total Bilirubin: 0.5 mg/dL (ref 0.3–1.2)

## 2015-03-29 LAB — URINE MICROSCOPIC-ADD ON

## 2015-03-29 LAB — HCG, SERUM, QUALITATIVE: Preg, Serum: NEGATIVE

## 2015-03-29 LAB — ABO/RH: ABO/RH(D): A POS

## 2015-03-29 NOTE — Progress Notes (Signed)
03-29-15 1545 Note urinalysis report in Epic.

## 2015-04-03 ENCOUNTER — Ambulatory Visit (HOSPITAL_COMMUNITY): Payer: BC Managed Care – PPO | Admitting: Anesthesiology

## 2015-04-03 ENCOUNTER — Encounter (HOSPITAL_COMMUNITY)
Admission: RE | Disposition: A | Discharge status: Home / Self Care | Payer: Self-pay | Source: Ambulatory Visit | Attending: Gynecologic Oncology

## 2015-04-03 ENCOUNTER — Encounter (HOSPITAL_COMMUNITY): Payer: Self-pay | Admitting: *Deleted

## 2015-04-03 ENCOUNTER — Ambulatory Visit (HOSPITAL_COMMUNITY)
Admission: RE | Admit: 2015-04-03 | Discharge: 2015-04-04 | Disposition: A | Discharge status: Home / Self Care | Payer: BC Managed Care – PPO | Source: Ambulatory Visit | Attending: Gynecologic Oncology | Admitting: Gynecologic Oncology

## 2015-04-03 DIAGNOSIS — N8302 Follicular cyst of left ovary: Secondary | ICD-10-CM | POA: Diagnosis not present

## 2015-04-03 DIAGNOSIS — E669 Obesity, unspecified: Secondary | ICD-10-CM | POA: Insufficient documentation

## 2015-04-03 DIAGNOSIS — Z6835 Body mass index (BMI) 35.0-35.9, adult: Secondary | ICD-10-CM | POA: Insufficient documentation

## 2015-04-03 DIAGNOSIS — N838 Other noninflammatory disorders of ovary, fallopian tube and broad ligament: Secondary | ICD-10-CM | POA: Insufficient documentation

## 2015-04-03 DIAGNOSIS — N8301 Follicular cyst of right ovary: Secondary | ICD-10-CM | POA: Insufficient documentation

## 2015-04-03 DIAGNOSIS — Z79899 Other long term (current) drug therapy: Secondary | ICD-10-CM | POA: Diagnosis not present

## 2015-04-03 DIAGNOSIS — D259 Leiomyoma of uterus, unspecified: Secondary | ICD-10-CM | POA: Insufficient documentation

## 2015-04-03 DIAGNOSIS — R7303 Prediabetes: Secondary | ICD-10-CM | POA: Diagnosis not present

## 2015-04-03 DIAGNOSIS — K660 Peritoneal adhesions (postprocedural) (postinfection): Secondary | ICD-10-CM | POA: Insufficient documentation

## 2015-04-03 DIAGNOSIS — C541 Malignant neoplasm of endometrium: Secondary | ICD-10-CM | POA: Diagnosis present

## 2015-04-03 HISTORY — PX: ROBOTIC ASSISTED TOTAL HYSTERECTOMY WITH BILATERAL SALPINGO OOPHERECTOMY: SHX6086

## 2015-04-03 LAB — TYPE AND SCREEN
ABO/RH(D): A POS
ANTIBODY SCREEN: NEGATIVE

## 2015-04-03 SURGERY — HYSTERECTOMY, TOTAL, ROBOT-ASSISTED, LAPAROSCOPIC, WITH BILATERAL SALPINGO-OOPHORECTOMY
Anesthesia: General | Laterality: Bilateral

## 2015-04-03 MED ORDER — LACTATED RINGERS IR SOLN
Status: DC | PRN
  Administered 2015-04-03: 1000 mL

## 2015-04-03 MED ORDER — SUGAMMADEX SODIUM 200 MG/2ML IV SOLN
INTRAVENOUS | Status: DC | PRN
  Administered 2015-04-03: 200 mg via INTRAVENOUS

## 2015-04-03 MED ORDER — ROCURONIUM BROMIDE 100 MG/10ML IV SOLN
INTRAVENOUS | Status: AC
  Filled 2015-04-03: qty 1

## 2015-04-03 MED ORDER — LACTATED RINGERS IV SOLN
INTRAVENOUS | Status: DC
  Administered 2015-04-03: 1000 mL via INTRAVENOUS

## 2015-04-03 MED ORDER — SUCCINYLCHOLINE CHLORIDE 20 MG/ML IJ SOLN
INTRAMUSCULAR | Status: DC | PRN
  Administered 2015-04-03: 180 mg via INTRAVENOUS

## 2015-04-03 MED ORDER — KCL IN DEXTROSE-NACL 20-5-0.45 MEQ/L-%-% IV SOLN
INTRAVENOUS | Status: DC
  Administered 2015-04-03: 1000 mL via INTRAVENOUS
  Filled 2015-04-03 (×2): qty 1000

## 2015-04-03 MED ORDER — MIDAZOLAM HCL 2 MG/2ML IJ SOLN
INTRAMUSCULAR | Status: AC
  Filled 2015-04-03: qty 2

## 2015-04-03 MED ORDER — DEXAMETHASONE SODIUM PHOSPHATE 10 MG/ML IJ SOLN
INTRAMUSCULAR | Status: DC | PRN
  Administered 2015-04-03: 10 mg via INTRAVENOUS

## 2015-04-03 MED ORDER — OXYCODONE-ACETAMINOPHEN 5-325 MG PO TABS: 1.0000 | ORAL_TABLET | ORAL | Status: DC | PRN

## 2015-04-03 MED ORDER — FENTANYL CITRATE (PF) 250 MCG/5ML IJ SOLN
INTRAMUSCULAR | Status: AC
  Filled 2015-04-03: qty 5

## 2015-04-03 MED ORDER — LIDOCAINE HCL (CARDIAC) 20 MG/ML IV SOLN
INTRAVENOUS | Status: DC | PRN
  Administered 2015-04-03: 50 mg via INTRAVENOUS

## 2015-04-03 MED ORDER — GABAPENTIN 300 MG PO CAPS
600.0000 mg | ORAL_CAPSULE | Freq: Every day | ORAL | Status: AC
  Administered 2015-04-03: 600 mg via ORAL
  Filled 2015-04-03: qty 2

## 2015-04-03 MED ORDER — HYDROMORPHONE HCL 1 MG/ML IJ SOLN
INTRAMUSCULAR | Status: AC
  Filled 2015-04-03: qty 1

## 2015-04-03 MED ORDER — GABAPENTIN 600 MG PO TABS
600.0000 mg | ORAL_TABLET | Freq: Every day | ORAL | Status: DC
  Filled 2015-04-03: qty 1

## 2015-04-03 MED ORDER — FENTANYL CITRATE (PF) 100 MCG/2ML IJ SOLN
INTRAMUSCULAR | Status: DC | PRN
  Administered 2015-04-03 (×2): 50 ug via INTRAVENOUS
  Administered 2015-04-03: 100 ug via INTRAVENOUS
  Administered 2015-04-03: 50 ug via INTRAVENOUS

## 2015-04-03 MED ORDER — SUGAMMADEX SODIUM 200 MG/2ML IV SOLN
INTRAVENOUS | Status: AC
  Filled 2015-04-03: qty 2

## 2015-04-03 MED ORDER — STERILE WATER FOR INJECTION IJ SOLN
INTRAMUSCULAR | Status: AC
  Filled 2015-04-03: qty 10

## 2015-04-03 MED ORDER — ONDANSETRON HCL 4 MG/2ML IJ SOLN
4.0000 mg | Freq: Four times a day (QID) | INTRAMUSCULAR | Status: DC | PRN
  Administered 2015-04-04: 4 mg via INTRAVENOUS
  Filled 2015-04-03: qty 2

## 2015-04-03 MED ORDER — PROPOFOL 10 MG/ML IV BOLUS
INTRAVENOUS | Status: DC | PRN
  Administered 2015-04-03: 200 mg via INTRAVENOUS

## 2015-04-03 MED ORDER — LACTATED RINGERS IV SOLN: INTRAVENOUS | Status: DC

## 2015-04-03 MED ORDER — CEFAZOLIN SODIUM-DEXTROSE 2-3 GM-% IV SOLR
INTRAVENOUS | Status: AC
  Filled 2015-04-03: qty 50

## 2015-04-03 MED ORDER — PROPOFOL 10 MG/ML IV BOLUS
INTRAVENOUS | Status: AC
  Filled 2015-04-03: qty 20

## 2015-04-03 MED ORDER — ONDANSETRON HCL 4 MG/2ML IJ SOLN
INTRAMUSCULAR | Status: AC
  Filled 2015-04-03: qty 2

## 2015-04-03 MED ORDER — DEXAMETHASONE SODIUM PHOSPHATE 10 MG/ML IJ SOLN
INTRAMUSCULAR | Status: AC
  Filled 2015-04-03: qty 1

## 2015-04-03 MED ORDER — LIDOCAINE HCL (CARDIAC) 20 MG/ML IV SOLN
INTRAVENOUS | Status: AC
  Filled 2015-04-03: qty 5

## 2015-04-03 MED ORDER — CEFAZOLIN SODIUM-DEXTROSE 2-3 GM-% IV SOLR
2.0000 g | INTRAVENOUS | Status: AC
  Administered 2015-04-03: 2 g via INTRAVENOUS

## 2015-04-03 MED ORDER — ENOXAPARIN SODIUM 40 MG/0.4ML ~~LOC~~ SOLN
40.0000 mg | SUBCUTANEOUS | Status: DC
  Administered 2015-04-04: 40 mg via SUBCUTANEOUS
  Filled 2015-04-03 (×2): qty 0.4

## 2015-04-03 MED ORDER — MIDAZOLAM HCL 5 MG/5ML IJ SOLN
INTRAMUSCULAR | Status: DC | PRN
  Administered 2015-04-03: 2 mg via INTRAVENOUS

## 2015-04-03 MED ORDER — STERILE WATER FOR IRRIGATION IR SOLN
Status: DC | PRN
  Administered 2015-04-03: 1000 mL

## 2015-04-03 MED ORDER — BUPIVACAINE LIPOSOME 1.3 % IJ SUSP
20.0000 mL | Freq: Once | INTRAMUSCULAR | Status: DC
  Filled 2015-04-03: qty 20

## 2015-04-03 MED ORDER — ONDANSETRON HCL 4 MG/2ML IJ SOLN
INTRAMUSCULAR | Status: DC | PRN
  Administered 2015-04-03: 4 mg via INTRAVENOUS

## 2015-04-03 MED ORDER — ONDANSETRON HCL 4 MG PO TABS: 4.0000 mg | ORAL_TABLET | Freq: Four times a day (QID) | ORAL | Status: DC | PRN

## 2015-04-03 MED ORDER — ENOXAPARIN SODIUM 40 MG/0.4ML ~~LOC~~ SOLN
40.0000 mg | SUBCUTANEOUS | Status: AC
  Administered 2015-04-03: 40 mg via SUBCUTANEOUS
  Filled 2015-04-03: qty 0.4

## 2015-04-03 MED ORDER — KETOROLAC TROMETHAMINE 15 MG/ML IJ SOLN
15.0000 mg | Freq: Four times a day (QID) | INTRAMUSCULAR | Status: DC
  Administered 2015-04-03 – 2015-04-04 (×3): 15 mg via INTRAVENOUS
  Filled 2015-04-03 (×5): qty 1

## 2015-04-03 MED ORDER — HYDROMORPHONE HCL 1 MG/ML IJ SOLN
0.2500 mg | INTRAMUSCULAR | Status: DC | PRN
  Administered 2015-04-03 (×3): 0.5 mg via INTRAVENOUS

## 2015-04-03 MED ORDER — CETYLPYRIDINIUM CHLORIDE 0.05 % MT LIQD: 7.0000 mL | Freq: Two times a day (BID) | OROMUCOSAL | Status: DC

## 2015-04-03 MED ORDER — ROCURONIUM BROMIDE 100 MG/10ML IV SOLN
INTRAVENOUS | Status: DC | PRN
  Administered 2015-04-03: 50 mg via INTRAVENOUS

## 2015-04-03 MED ORDER — HYDROMORPHONE HCL 1 MG/ML IJ SOLN: 0.2000 mg | INTRAMUSCULAR | Status: DC | PRN

## 2015-04-03 SURGICAL SUPPLY — 45 items
BAG SPEC RTRVL LRG 6X4 10 (ENDOMECHANICALS)
CHLORAPREP W/TINT 26ML (MISCELLANEOUS) ×2 IMPLANT
COVER TIP SHEARS 8 DVNC (MISCELLANEOUS) ×1 IMPLANT
COVER TIP SHEARS 8MM DA VINCI (MISCELLANEOUS) ×1
DRAPE COLUMN DVNC XI (DISPOSABLE) ×1 IMPLANT
DRAPE DA VINCI XI COLUMN (DISPOSABLE) ×1
DRAPE SHEET LG 3/4 BI-LAMINATE (DRAPES) ×4 IMPLANT
DRAPE SURG IRRIG POUCH 19X23 (DRAPES) ×2 IMPLANT
ELECT PENCIL ROCKER SW 15FT (MISCELLANEOUS) ×1 IMPLANT
ELECT REM PT RETURN 9FT ADLT (ELECTROSURGICAL) ×2
ELECTRODE REM PT RTRN 9FT ADLT (ELECTROSURGICAL) ×1 IMPLANT
GLOVE BIO SURGEON STRL SZ 6 (GLOVE) ×8 IMPLANT
GLOVE BIO SURGEON STRL SZ 6.5 (GLOVE) ×4 IMPLANT
GOWN STRL REUS W/ TWL LRG LVL3 (GOWN DISPOSABLE) ×2 IMPLANT
GOWN STRL REUS W/TWL LRG LVL3 (GOWN DISPOSABLE) ×4
HOLDER FOLEY CATH W/STRAP (MISCELLANEOUS) ×1 IMPLANT
KIT BASIN OR (CUSTOM PROCEDURE TRAY) ×2 IMPLANT
LIQUID BAND (GAUZE/BANDAGES/DRESSINGS) ×2 IMPLANT
MANIPULATOR UTERINE 4.5 ZUMI (MISCELLANEOUS) ×2 IMPLANT
MARKER SKIN DUAL TIP RULER LAB (MISCELLANEOUS) ×2 IMPLANT
OBTURATOR XI 8MM BLADELESS (TROCAR) ×2 IMPLANT
OCCLUDER COLPOPNEUMO (BALLOONS) ×2 IMPLANT
PAD POSITIONING PINK XL (MISCELLANEOUS) ×2 IMPLANT
PORT ACCESS TROCAR AIRSEAL 12 (TROCAR) ×1 IMPLANT
PORT ACCESS TROCAR AIRSEAL 5M (TROCAR) ×1
POUCH ENDO CATCH II 15MM (MISCELLANEOUS) IMPLANT
POUCH SPECIMEN RETRIEVAL 10MM (ENDOMECHANICALS) IMPLANT
SET BI-LUMEN FLTR TB AIRSEAL (TUBING) ×2 IMPLANT
SET TUBE IRRIG SUCTION NO TIP (IRRIGATION / IRRIGATOR) ×2 IMPLANT
SHEET LAVH (DRAPES) ×2 IMPLANT
SOLUTION ELECTROLUBE (MISCELLANEOUS) ×2 IMPLANT
SUT VIC AB 0 CT1 27 (SUTURE) ×2
SUT VIC AB 0 CT1 27XBRD ANTBC (SUTURE) ×1 IMPLANT
SUT VIC AB 4-0 PS2 27 (SUTURE) ×4 IMPLANT
SYR 50ML LL SCALE MARK (SYRINGE) ×2 IMPLANT
TOWEL OR 17X26 10 PK STRL BLUE (TOWEL DISPOSABLE) ×4 IMPLANT
TOWEL OR NON WOVEN STRL DISP B (DISPOSABLE) ×2 IMPLANT
TRAP SPECIMEN MUCOUS 40CC (MISCELLANEOUS) IMPLANT
TRAY FOLEY W/METER SILVER 14FR (SET/KITS/TRAYS/PACK) ×2 IMPLANT
TRAY FOLEY W/METER SILVER 16FR (SET/KITS/TRAYS/PACK) ×1 IMPLANT
TRAY LAPAROSCOPIC (CUSTOM PROCEDURE TRAY) ×2 IMPLANT
TROCAR BLADELESS OPT 5 100 (ENDOMECHANICALS) ×2 IMPLANT
TROCAR XCEL 12X100 BLDLESS (ENDOMECHANICALS) ×2 IMPLANT
WATER STERILE IRR 1500ML POUR (IV SOLUTION) ×2 IMPLANT
YANKAUER SUCT BULB TIP NO VENT (SUCTIONS) ×1 IMPLANT

## 2015-04-03 NOTE — H&P (View-Only) (Signed)
Consult Note: Gyn-Onc  Consult was requested by Dr. Matthew Saras for the evaluation of Suzanne [name] 50 y.o. female  CC:  Chief Complaint  Patient presents with  . endometrial cancer    Assessment/Plan:  Suzanne [name]  is a 50 y.o.  year old with FIGO grade 1 endometrial cancer. She is obese with a BMI of 35kg/m2.   A detailed discussion was held with the patient and her family with regard to to her endometrial cancer diagnosis. We discussed the standard management options for uterine cancer which includes surgery followed possibly by adjuvant therapy depending on the results of surgery. The options for surgical management include a hysterectomy and removal of the tubes and ovaries possibly with removal of pelvic and para-aortic lymph nodes. A minimally invasive approach including a robotic hysterectomy or laparoscopic hysterectomy have benefits including shorter hospital stay, recovery time and better wound healing. The alternative approach is an open hysterectomy. The patient has been counseled about these surgical options and the risks of surgery in general including infection, bleeding, damage to surrounding structures (including bowel, bladder, ureters, nerves or vessels), and the postoperative risks of PE/ DVT, and lymphedema. I extensively reviewed the additional risks of robotic hysterectomy including possible need for conversion to open laparotomy.  I discussed positioning during surgery of trendelenberg and risks of minor facial swelling and care we take in preoperative positioning.  After counseling and consideration of her options, she desires to proceed with robotic assisted total hysterectomy, BSO and sentinel lymph node biopsy.   She will be seen by anesthesia for preoperative clearance and discussion of postoperative pain management.  She was given the opportunity to ask questions, which were answered to her satisfaction, and she is agreement with the above mentioned  plan of care.  I discussed that due to her larger uterine size with fibroids, and her nulliparity, specimen delivery may not be possible vaginally, in which case we would perform a minilaparotomy for intact specimen removal.   HPI: The patient is a 50 year old G0 was seen in consultation at the request of Dr. Matthew Saras for grade 1 endometrial cancer. The patient reports a long-standing history of many years of abnormal uterine bleeding and irregular menses. She reported this to Dr. Matthew Saras who performed an ultrasound scan in July 2016. This revealed a uterus measuring 11 x 6.6 x 6.4 cm containing multiple fibroids including 4.4 cm subserosal fibroid to semi-intramural fibroid and possibly 3 cm fibroid within the endometrial cavity. No adnexal masses were identified. As follow-up to she then underwent a D&C on 02/21/2015 which revealed well-differentiated endometrioid adenocarcinoma FIGO grade 1. Of note the patient had a Pap smear in July 2016 which showed ASCUS with negative high-risk HPV.  She is otherwise fairly healthy. She's had 2 prior abdominal surgeries for ovarian cysts one in the 80s that was fired laparotomy any other laparoscopically. She has prediabetes is on no medication for this. She is a maternal history for breast cancer.   Current Meds:  Outpatient Encounter Prescriptions as of 03/19/2015  Medication Sig  . Ascorbic Acid (VITAMIN C) 100 MG tablet Take 500 mg by mouth daily.   Marland Kitchen b complex vitamins tablet Take 1 tablet by mouth daily.  Marland Kitchen BIOTIN PO Take 1,000 mcg by mouth every morning.  . Multiple Vitamin (MULTIVITAMIN) tablet Take 1 tablet by mouth daily.  . Nutritional Supplements (SILICA PO) Take by mouth every morning.  . [DISCONTINUED] HYDROcodone-acetaminophen (NORCO/VICODIN) 5-325 MG per tablet Take 1 tablet by  mouth every 4 (four) hours as needed.  . [DISCONTINUED] metroNIDAZOLE (FLAGYL) 500 MG tablet Take 1 tablet twice a day for 7 days.  . [DISCONTINUED] naproxen (NAPROSYN)  375 MG tablet Take 1 tablet (375 mg total) by mouth 2 (two) times daily.   No facility-administered encounter medications on file as of 03/19/2015.    Allergy: No Known Allergies  Social Hx:   Social History   Social History  . Marital Status: Widowed    Spouse Name: N/A  . Number of Children: N/A  . Years of Education: N/A   Occupational History  . Not on file.   Social History Main Topics  . Smoking status: Never Smoker   . Smokeless tobacco: Not on file  . Alcohol Use: 0.0 oz/week     Comment: OCCASIONAL  . Drug Use: No  . Sexual Activity: Not on file   Other Topics Concern  . Not on file   Social History Narrative    Past Surgical Hx:  Past Surgical History  Procedure Laterality Date  . Ovarian tumor removal      Past Medical Hx:  Past Medical History  Diagnosis Date  . Ovarian tumor (benign)     Past Gynecological History:  G0  No LMP recorded.  Family Hx:  Family History  Problem Relation Age of Onset  . Arthritis Mother     Review of Systems:  Constitutional  Feels well,    ENT Normal appearing ears and nares bilaterally Skin/Breast  No rash, sores, jaundice, itching, dryness Cardiovascular  No chest pain, shortness of breath, or edema  Pulmonary  No cough or wheeze.  Gastro Intestinal  No nausea, vomitting, or diarrhoea. No bright red blood per rectum, no abdominal pain, change in bowel movement, or constipation.  Genito Urinary  No frequency, urgency, dysuria, + abnormal uterine bleeding Musculo Skeletal  No myalgia, arthralgia, joint swelling or pain  Neurologic  No weakness, numbness, change in gait,  Psychology  No depression, anxiety, insomnia.   Vitals:  Blood pressure 146/72, pulse 78, temperature 98.1 F (36.7 C), temperature source Oral, resp. rate 18, height 5\' 6"  (1.676 m), weight 219 lb (99.338 kg), SpO2 100 %.  Physical Exam: WD in NAD Neck  Supple NROM, without any enlargements.  Lymph Node Survey No cervical  supraclavicular or inguinal adenopathy Cardiovascular  Pulse normal rate, regularity and rhythm. S1 and S2 normal.  Lungs  Clear to auscultation bilateraly, without wheezes/crackles/rhonchi. Good air movement.  Skin  No rash/lesions/breakdown  Psychiatry  Alert and oriented to person, place, and time  Abdomen  Normoactive bowel sounds, abdomen soft, non-tender and obese without evidence of hernia.  Back No CVA tenderness Genito Urinary  Vulva/vagina: Normal external female genitalia.  No lesions. No discharge or bleeding.  Bladder/urethra:  No lesions or masses, well supported bladder  Vagina: narrow, no lesions  Cervix: Normal appearing, no lesions.  Uterus: bulky, 12cm, mobile, no parametrial involvement or nodularity.  Adnexa: no palpable masses. Rectal  Good tone, no masses no cul de sac nodularity.  Extremities  No bilateral cyanosis, clubbing or edema.   Donaciano Eva, MD  03/19/2015, 5:00 PM

## 2015-04-03 NOTE — Anesthesia Procedure Notes (Signed)

## 2015-04-03 NOTE — Interval H&P Note (Signed)
History and Physical Interval Note:  04/03/2015 11:01 AM  Suzanne [name]  has presented today for surgery, with the diagnosis of endometrial cancer  The various methods of treatment have been discussed with the patient and family. After consideration of risks, benefits and other options for treatment, the patient has consented to  Procedure(s): XI ROBOTIC ASSISTED TOTAL HYSTERECTOMY WITH BILATERAL SALPINGO OOPHORECTOMY WITH SENTINAL LYMPH NODE BIOPSY (Bilateral) as a surgical intervention .  The patient's history has been reviewed, patient examined, no change in status, stable for surgery.  I have reviewed the patient's chart and labs.  Questions were answered to the patient's satisfaction.     Donaciano Eva

## 2015-04-03 NOTE — Anesthesia Preprocedure Evaluation (Signed)
Anesthesia Evaluation  Patient identified by MRN, date of birth, ID band Patient awake    Reviewed: Allergy & Precautions, H&P , NPO status , Patient's Chart, lab work & pertinent test results  Airway Mallampati: II  TM Distance: >3 FB Neck ROM: full    Dental no notable dental hx. (+) Dental Advisory Given, Teeth Intact   Pulmonary neg pulmonary ROS, shortness of breath and with exertion,    Pulmonary exam normal breath sounds clear to auscultation       Cardiovascular Exercise Tolerance: Good negative cardio ROS Normal cardiovascular exam Rhythm:regular Rate:Normal     Neuro/Psych negative neurological ROS  negative psych ROS   GI/Hepatic negative GI ROS, Neg liver ROS,   Endo/Other  negative endocrine ROS  Renal/GU negative Renal ROS  negative genitourinary   Musculoskeletal   Abdominal   Peds  Hematology negative hematology ROS (+)   Anesthesia Other Findings Ovarian tumor  Reproductive/Obstetrics negative OB ROS                             Anesthesia Physical Anesthesia Plan  ASA: II  Anesthesia Plan: General   Post-op Pain Management:    Induction: Intravenous  Airway Management Planned: Oral ETT  Additional Equipment:   Intra-op Plan:   Post-operative Plan: Extubation in OR  Informed Consent: I have reviewed the patients History and Physical, chart, labs and discussed the procedure including the risks, benefits and alternatives for the proposed anesthesia with the patient or authorized representative who has indicated his/her understanding and acceptance.   Dental Advisory Given  Plan Discussed with: CRNA and Surgeon  Anesthesia Plan Comments:         Anesthesia Quick Evaluation

## 2015-04-03 NOTE — Transfer of Care (Signed)
Immediate Anesthesia Transfer of Care Note  Patient: Suzanne [name]  Procedure(s) Performed: Procedure(s): XI ROBOTIC ASSISTED TOTAL HYSTERECTOMY WITH BILATERAL SALPINGO OOPHORECTOMY WITH SENTINAL LYMPH NODE BIOPSY (Bilateral)  Patient Location: PACU  Anesthesia Type:General  Level of Consciousness: awake, alert , oriented and patient cooperative  Airway & Oxygen Therapy: Patient Spontanous Breathing and Patient connected to face mask oxygen  Post-op Assessment: Report given to RN, Post -op Vital signs reviewed and stable and Patient moving all extremities  Post vital signs: Reviewed and stable  Last Vitals:  Filed Vitals:   04/03/15 0937  BP: 143/77  Pulse: 76  Temp: 36.6 C  Resp: 18    Complications: No apparent anesthesia complications

## 2015-04-03 NOTE — Op Note (Signed)
OPERATIVE NOTE 04/03/15  Surgeon: Donaciano Eva   Assistants: Dr Lahoma Crocker (an MD assistant was necessary for tissue manipulation, management of robotic instrumentation, retraction and positioning due to the complexity of the case and hospital policies).   Anesthesia: General endotracheal anesthesia  ASA Class: 3   Pre-operative Diagnosis: fibroids and uterine cancer  Post-operative Diagnosis: same  Operation: Robotic-assisted laparoscopic hysterectomy with bilateral salpingoophorectomy, sentinel lymph node biopsy  Surgeon: Donaciano Eva  Assistant Surgeon: Lahoma Crocker MD  Anesthesia: GET  Urine Output: 100  Operative Findings:  : 14 cm size fibroid uterus, normal ovaries, dense adhesions between uterine fibroids and sigmoid colon and bladder.  Estimated Blood Loss:  less than 50 mL      Total IV Fluids: 400 ml         Specimens: washings, uterus, cervix, bilateral tubes and ovaries, right internal iliac SLN, right obturator SLN, left obturator SLN.         Complications:  None; patient tolerated the procedure well.         Disposition: PACU - hemodynamically stable.  Procedure Details  The patient was seen in the Holding Room. The risks, benefits, complications, treatment options, and expected outcomes were discussed with the patient.  The patient concurred with the proposed plan, giving informed consent.  The site of surgery properly noted/marked. The patient was identified as Suzanne [name] and the procedure verified as a Robotic-assisted hysterectomy with bilateral salpingo oophorectomy and sentinel lymph node biopsy. A Time Out was held and the above information confirmed.  After induction of anesthesia, the patient was draped and prepped in the usual sterile manner. Pt was placed in supine position after anesthesia and draped and prepped in the usual sterile manner. The abdominal drape was placed after the CholoraPrep had been allowed to  dry for 3 minutes.  Her arms were tucked to her side with all appropriate precautions.  The shoulders were secured with padded shoulder blocks on the acromium processes.  The patient was placed in the semi-lithotomy position in [place].  The perineum was prepped with Betadine.  Foley catheter was placed.  A sterile speculum was placed in the vagina.  The cervix was grasped with a single-tooth tenaculum and dilated with Kennon Rounds dilators. 1mg  total of ICG was injected into the cervical stroma at 2 and 9 o'clock at a 22mm depth (concentration 0..5mg /ml).  The ZUMI uterine manipulator with a medium colpotomizer ring was placed without difficulty.  A pneum occluder balloon was placed over the manipulator.  A second time-out was performed.  OG tube placement was confirmed and to suction.    Procedure:  The patient was then prepped.  A Foley was placed to gravity.  A medium size KOH ring was used to place around the cervix after the cervix had been dilated and then a RUMI manipulator was attached in the normal manner.  The patient was then draped in the normal manner.  Next, a 5 mm skin incision was made 1 cm below the subcostal margin in the midclavicular line.  The 5 mm Optiview port and scope was used for direct entry.  Opening pressure was under 10 mm CO2.  The abdomen was insufflated and the findings were noted as above.   At this point and all points during the procedure, the patient's intra-abdominal pressure did not exceed 15 mmHg. Next, a 8 mm skin incision was made 2cm above the umbilicus and a right and left port was placed about 10 cm  lateral to the robot port on the right and left side.  A fourth arm was placed in the left lower quadrant 2 cm above and superior and medial to the anterior superior iliac spine.  All ports were placed under direct visualization.  The patient was placed in steep Trendelenburg.  Bowel was away into the upper abdomen.  The robot was docked in the normal manner.  The right  and left peritoneum were opened parallel to the IP ligament to open the retroperitoneal spaces bilaterally. The SLN mapping was performed in bilateral pelvic basins. The para rectal and paravesical spaces were opened up. Lymphatic channels were identified travelling to the following visualized sentinel lymph node's: in the right internal iliac SLN, right obturator sentinel node and left obturator SLN. These SLN's were separated from their surrounding lymphatic tissue, removed and sent for permanent pathology.  The hysterectomy was started by first performing adhesiolysis to free the sigmoid adhesions to the left uterine fibroids. The round ligament on the right side was incised and the retroperitoneum was entered and the pararectal space was developed.  The ureter was noted to be on the medial leaf of the broad ligament.  The peritoneum above the ureter was incised and stretched and the infundibulopelvic ligament was skeletonized, cauterized and cut.  The posterior peritoneum was taken down to the level of the KOH ring.  The anterior peritoneum was also taken down with meticulous sharp dissection. There were dense adhesions between the bladder and the anterior lower uterine segment. The bladder flap was created to the level of the KOH ring.  The uterine artery on the right side was skeletonized, cauterized and cut in the normal manner.  A similar procedure was performed on the left.  The colpotomy was made and the uterus, cervix, bilateral ovaries and tubes were amputated and delivered through the vagina.  Pedicles were inspected and excellent hemostasis was achieved.    The colpotomy at the vaginal cuff was closed with Vicryl on a CT1 needle in a running manner.  Irrigation was used and excellent hemostasis was achieved.  At this point in the procedure was completed.  Robotic instruments were removed under direct visulaization.  The robot was undocked. The 10 mm ports were closed with Vicryl on a UR-5 needle  and the fascia was closed with 0 Vicryl on a UR-5 needle.  The skin was closed with 4-0 Vicryl in a subcuticular manner.  Dermabond was applied.  Sponge, lap and needle counts correct x 2.  The patient was taken to the recovery room in stable condition.  The vagina was swabbed with  minimal bleeding noted.   All instrument and needle counts were correct x  3.   The patient was transferred to the recovery room in a stable condition.  Donaciano Eva, MD

## 2015-04-04 ENCOUNTER — Encounter (HOSPITAL_COMMUNITY): Payer: Self-pay | Admitting: Gynecologic Oncology

## 2015-04-04 DIAGNOSIS — C541 Malignant neoplasm of endometrium: Secondary | ICD-10-CM | POA: Diagnosis not present

## 2015-04-04 LAB — BASIC METABOLIC PANEL
ANION GAP: 7 (ref 5–15)
BUN: 7 mg/dL (ref 6–20)
CALCIUM: 8.7 mg/dL — AB (ref 8.9–10.3)
CO2: 25 mmol/L (ref 22–32)
CREATININE: 0.82 mg/dL (ref 0.44–1.00)
Chloride: 103 mmol/L (ref 101–111)
GFR calc Af Amer: >60 mL/min (ref >60)
GLUCOSE: 143 mg/dL — AB (ref 65–99)
Potassium: 4.3 mmol/L (ref 3.5–5.1)
Sodium: 135 mmol/L (ref 135–145)

## 2015-04-04 LAB — CBC
HEMATOCRIT: 36.1 % (ref 36.0–46.0)
Hemoglobin: 11.5 g/dL — ABNORMAL LOW (ref 12.0–15.0)
MCH: 29.9 pg (ref 26.0–34.0)
MCHC: 31.9 g/dL (ref 30.0–36.0)
MCV: 93.8 fL (ref 78.0–100.0)
PLATELETS: 250 10*3/uL (ref 150–400)
RBC: 3.85 MIL/uL — ABNORMAL LOW (ref 3.87–5.11)
RDW: 13.3 % (ref 11.5–15.5)
WBC: 10.6 10*3/uL — AB (ref 4.0–10.5)

## 2015-04-04 MED ORDER — OXYCODONE-ACETAMINOPHEN 5-325 MG PO TABS: 1.0000 | ORAL_TABLET | ORAL | Status: DC | PRN

## 2015-04-04 MED ORDER — ZOLPIDEM TARTRATE 5 MG PO TABS
5.0000 mg | ORAL_TABLET | Freq: Every evening | ORAL | Status: DC | PRN
  Administered 2015-04-04: 5 mg via ORAL
  Filled 2015-04-04: qty 1

## 2015-04-04 NOTE — Progress Notes (Signed)
Discharge instructions reviewed with patient and prescription given. Questions answered.  Donne Hazel, RN 04/04/15

## 2015-04-04 NOTE — Anesthesia Postprocedure Evaluation (Signed)
Anesthesia Post Note  Patient: Suzanne [name]  Procedure(s) Performed: Procedure(s) (LRB): XI ROBOTIC ASSISTED TOTAL HYSTERECTOMY WITH BILATERAL SALPINGO OOPHORECTOMY WITH SENTINAL LYMPH NODE BIOPSY (Bilateral)  Patient location during evaluation: PACU Anesthesia Type: General Level of consciousness: awake and alert Pain management: pain level controlled Vital Signs Assessment: post-procedure vital signs reviewed and stable Respiratory status: spontaneous breathing, nonlabored ventilation, respiratory function stable and patient connected to nasal cannula oxygen Cardiovascular status: blood pressure returned to baseline and stable Postop Assessment: no signs of nausea or vomiting Anesthetic complications: no    Last Vitals:  Filed Vitals:   04/04/15 0154 04/04/15 0545  BP: 128/73 146/83  Pulse: 73 72  Temp: 37.6 C 37.1 C  Resp: 18 18    Last Pain:  Filed Vitals:   04/04/15 0803  PainSc: 0-No pain                 [name] L

## 2015-04-04 NOTE — Discharge Instructions (Signed)
04/04/2015  Return to work: 4-6 weeks if applicable  Activity: 1. Be up and out of the bed during the day.  Take a nap if needed.  You may walk up steps but be careful and use the hand rail.  Stair climbing will tire you more than you think, you may need to stop part way and rest.   2. No lifting or straining for 6 weeks.  3. No driving for 1 week(s).  Do not drive if you are taking narcotic pain medicine.  4. Shower daily.  Use soap and water on your incision and pat dry; don't rub.  No tub baths until cleared by your surgeon.   5. No sexual activity and nothing in the vagina for 6 weeks.  6. You may experience a small amount of clear drainage from your incisions, which is normal.  If the drainage persists or increases, please call the office.   Diet: 1. Low sodium Heart Healthy Diet is recommended.  2. It is safe to use a laxative, such as Miralax or Colace, if you have difficulty moving your bowels.   Wound Care: 1. Keep clean and dry.  Shower daily.  Reasons to call the Doctor:  Fever - Oral temperature greater than 100.4 degrees Fahrenheit  Foul-smelling vaginal discharge  Difficulty urinating  Nausea and vomiting  Increased pain at the site of the incision that is unrelieved with pain medicine.  Difficulty breathing with or without chest pain  New calf pain especially if only on one side  Sudden, continuing increased vaginal bleeding with or without clots.   Contacts: For questions or concerns you should contact:  Dr. Everitt Amber at [phone]  Joylene John, NP at [phone]  After Hours: call [phone] and have the GYN Oncologist paged/contacted  Acetaminophen; Oxycodone tablets What is this medicine? ACETAMINOPHEN; OXYCODONE (a set a MEE noe fen; ox i KOE done) is a pain reliever. It is used to treat moderate to severe pain. This medicine may be used for other purposes; ask your health care provider or pharmacist if you have questions. What  should I tell my health care provider before I take this medicine? They need to know if you have any of these conditions: -brain tumor -Crohn's disease, inflammatory bowel disease, or ulcerative colitis -drug abuse or addiction -head injury -heart or circulation problems -if you often drink alcohol -kidney disease or problems going to the bathroom -liver disease -lung disease, asthma, or breathing problems -an unusual or allergic reaction to acetaminophen, oxycodone, other opioid analgesics, other medicines, foods, dyes, or preservatives -pregnant or trying to get pregnant -breast-feeding How should I use this medicine? Take this medicine by mouth with a full glass of water. Follow the directions on the prescription label. You can take it with or without food. If it upsets your stomach, take it with food. Take your medicine at regular intervals. Do not take it more often than directed. Talk to your pediatrician regarding the use of this medicine in children. Special care may be needed. Patients over 62 years old may have a stronger reaction and need a smaller dose. Overdosage: If you think you have taken too much of this medicine contact a poison control center or emergency room at once. NOTE: This medicine is only for you. Do not share this medicine with others. What if I miss a dose? If you miss a dose, take it as soon as you can. If it is almost time for your next dose, take only that dose.  Do not take double or extra doses. What may interact with this medicine? -alcohol -antihistamines -barbiturates like amobarbital, butalbital, butabarbital, methohexital, pentobarbital, phenobarbital, thiopental, and secobarbital -benztropine -drugs for bladder problems like solifenacin, trospium, oxybutynin, tolterodine, hyoscyamine, and methscopolamine -drugs for breathing problems like ipratropium and tiotropium -drugs for certain stomach or intestine problems like propantheline, homatropine  methylbromide, glycopyrrolate, atropine, belladonna, and dicyclomine -general anesthetics like etomidate, ketamine, nitrous oxide, propofol, desflurane, enflurane, halothane, isoflurane, and sevoflurane -medicines for depression, anxiety, or psychotic disturbances -medicines for sleep -muscle relaxants -naltrexone -narcotic medicines (opiates) for pain -phenothiazines like perphenazine, thioridazine, chlorpromazine, mesoridazine, fluphenazine, prochlorperazine, promazine, and trifluoperazine -scopolamine -tramadol -trihexyphenidyl This list may not describe all possible interactions. Give your health care provider a list of all the medicines, herbs, non-prescription drugs, or dietary supplements you use. Also tell them if you smoke, drink alcohol, or use illegal drugs. Some items may interact with your medicine. What should I watch for while using this medicine? Tell your doctor or health care professional if your pain does not go away, if it gets worse, or if you have new or a different type of pain. You may develop tolerance to the medicine. Tolerance means that you will need a higher dose of the medication for pain relief. Tolerance is normal and is expected if you take this medicine for a long time. Do not suddenly stop taking your medicine because you may develop a severe reaction. Your body becomes used to the medicine. This does NOT mean you are addicted. Addiction is a behavior related to getting and using a drug for a non-medical reason. If you have pain, you have a medical reason to take pain medicine. Your doctor will tell you how much medicine to take. If your doctor wants you to stop the medicine, the dose will be slowly lowered over time to avoid any side effects. You may get drowsy or dizzy. Do not drive, use machinery, or do anything that needs mental alertness until you know how this medicine affects you. Do not stand or sit up quickly, especially if you are an older patient. This  reduces the risk of dizzy or fainting spells. Alcohol may interfere with the effect of this medicine. Avoid alcoholic drinks. There are different types of narcotic medicines (opiates) for pain. If you take more than one type at the same time, you may have more side effects. Give your health care provider a list of all medicines you use. Your doctor will tell you how much medicine to take. Do not take more medicine than directed. Call emergency for help if you have problems breathing. The medicine will cause constipation. Try to have a bowel movement at least every 2 to 3 days. If you do not have a bowel movement for 3 days, call your doctor or health care professional. Do not take Tylenol (acetaminophen) or medicines that have acetaminophen with this medicine. Too much acetaminophen can be very dangerous. Many nonprescription medicines contain acetaminophen. Always read the labels carefully to avoid taking more acetaminophen. What side effects may I notice from receiving this medicine? Side effects that you should report to your doctor or health care professional as soon as possible: -allergic reactions like skin rash, itching or hives, swelling of the face, lips, or tongue -breathing difficulties, wheezing -confusion -light headedness or fainting spells -severe stomach pain -unusually weak or tired -yellowing of the skin or the whites of the eyes Side effects that usually do not require medical attention (report to your doctor or health  care professional if they continue or are bothersome): -dizziness -drowsiness -nausea -vomiting This list may not describe all possible side effects. Call your doctor for medical advice about side effects. You may report side effects to FDA at 1-800-FDA-1088. Where should I keep my medicine? Keep out of the reach of children. This medicine can be abused. Keep your medicine in a safe place to protect it from theft. Do not share this medicine with anyone. Selling  or giving away this medicine is dangerous and against the law. This medicine may cause accidental overdose and death if it taken by other adults, children, or pets. Mix any unused medicine with a substance like cat litter or coffee grounds. Then throw the medicine away in a sealed container like a sealed bag or a coffee can with a lid. Do not use the medicine after the expiration date. Store at room temperature between 20 and 25 degrees C (68 and 77 degrees F). NOTE: This sheet is a summary. It may not cover all possible information. If you have questions about this medicine, talk to your doctor, pharmacist, or health care provider.    2016, Elsevier/Gold Standard. (2014-01-25 15:18:46)  Abdominal Hysterectomy, Care After These instructions give you information on caring for yourself after your procedure. Your doctor may also give you more specific instructions. Call your doctor if you have any problems or questions after your procedure.  HOME CARE It takes 4-6 weeks to recover from this surgery. Follow all of your doctor's instructions.   Only take medicines as told by your doctor.  Change your bandage as told by your doctor.  Return to your doctor to have your stitches taken out.  Take showers for 2-3 weeks. Ask your doctor when it is okay to shower.  Do not douche, use tampons, or have sex (intercourse) for at least 6 weeks or as told.  Follow your doctor's advice about exercise, lifting objects, driving, and general activities.  Get plenty of rest and sleep.  Do not lift anything heavier than a gallon of milk (about 10 pounds [4.5 kilograms]) for the first month after surgery.  Get back to your normal diet as told by your doctor.  Do not drink alcohol until your doctor says it is okay.  Take a medicine to help you poop (laxative) as told by your doctor.  Eating foods high in fiber may help you poop. Eat a lot of raw fruits and vegetables, whole grains, and beans.  Drink  enough fluids to keep your pee (urine) clear or pale yellow.  Have someone help you at home for 1-2 weeks after your surgery.  Keep follow-up doctor visits as told. GET HELP IF:  You have chills or fever.  You have puffiness, redness, or pain in area of the cut (incision).  You have yellowish-white fluid (pus) coming from the cut.  You have a bad smell coming from the cut or bandage.  Your cut pulls apart.  You feel dizzy or light-headed.  You have pain or bleeding when you pee.  You keep having watery poop (diarrhea).  You keep feeling sick to your stomach (nauseous) or keep throwing up (vomiting).  You have fluid (discharge) coming from your vagina.  You have a rash.  You have a reaction to your medicine.  You need stronger pain medicine. GET HELP RIGHT AWAY IF:   You have a fever and your symptoms suddenly get worse.  You have bad belly (abdominal) pain.  You have chest pain.  You  are short of breath.  You pass out (faint).  You have pain, puffiness, or redness of your leg.  You bleed a lot from your vagina and notice clumps of tissue (clots). MAKE SURE YOU:   Understand these instructions.  Will watch your condition.  Will get help right away if you are not doing well or get worse.   This information is not intended to replace advice given to you by your health care provider. Make sure you discuss any questions you have with your health care provider.   Document Released: 12/04/2007 Document Revised: 03/01/2013 Document Reviewed: 12/17/2012 Elsevier Interactive Patient Education [employer].

## 2015-04-04 NOTE — Discharge Summary (Signed)
Physician Discharge Summary  Patient ID: Suzanne [name] MRN: [id number] DOB/AGE: 1965/04/03 50 y.o.  Admit date: 04/03/2015 Discharge date: 04/04/2015  Admission Diagnoses: Endometrial cancer, grade I [facility])  Discharge Diagnoses:  Principal Problem:   Endometrial cancer, grade I ([place]) Active Problems:   Endometrial cancer [facility])   Discharged Condition:  The patient is in good condition and stable for discharge.    Hospital Course: On 04/03/2015, the patient underwent the following: Procedure(s): XI ROBOTIC ASSISTED TOTAL HYSTERECTOMY WITH BILATERAL SALPINGO OOPHORECTOMY WITH SENTINEL LYMPH NODE BIOPSY.  The postoperative course was uneventful.  She was discharged to home on postoperative day 1 tolerating a regular diet, minimal pain, voiding without difficulty.  Consults: None  Significant Diagnostic Studies: None  Treatments: surgery: see above  Discharge Exam: Blood pressure 146/83, pulse 72, temperature 98.7 F (37.1 C), temperature source Oral, resp. rate 18, height 5\' 6"  (1.676 m), weight 217 lb (98.431 kg), last menstrual period 03/05/2015, SpO2 100 %. General appearance: alert, cooperative and no distress Resp: clear to auscultation bilaterally Cardio: regular rate and rhythm, S1, S2 normal, no murmur, click, rub or gallop GI: soft, non-tender; bowel sounds normal; no masses,  no organomegaly Extremities: extremities normal, atraumatic, no cyanosis or edema Incision/Wound: Lap sites with dermabond without erythema or drainage  Disposition: 01-Home or Self Care      Discharge Instructions    Call MD for:  difficulty breathing, headache or visual disturbances    Complete by:  As directed      Call MD for:  extreme fatigue    Complete by:  As directed      Call MD for:  hives    Complete by:  As directed      Call MD for:  persistant dizziness or light-headedness    Complete by:  As directed      Call MD for:  persistant nausea and vomiting    Complete by:  As  directed      Call MD for:  redness, tenderness, or signs of infection (pain, swelling, redness, odor or green/yellow discharge around incision site)    Complete by:  As directed      Call MD for:  severe uncontrolled pain    Complete by:  As directed      Call MD for:  temperature >100.4    Complete by:  As directed      Diet - low sodium heart healthy    Complete by:  As directed      Driving Restrictions    Complete by:  As directed   No driving for 1 week.  Do not take narcotics and drive.     Increase activity slowly    Complete by:  As directed      Lifting restrictions    Complete by:  As directed   No lifting greater than 10 lbs.     Sexual Activity Restrictions    Complete by:  As directed   No sexual activity, nothing in the vagina, for 6 weeks.            Medication List    TAKE these medications        b complex vitamins tablet  Take 1 tablet by mouth daily.     BILBERRY PLUS PO  Take 1 tablet by mouth daily.     BIOTIN PO  Take 1,000 mcg by mouth every morning.     FUSION PLUS PO  Take 1 capsule by mouth daily.  ibuprofen 200 MG tablet  Commonly known as:  ADVIL,MOTRIN  Take 200 mg by mouth every 6 (six) hours as needed for moderate pain.     MSM PO  Take 1 tablet by mouth daily.     multivitamin tablet  Take 1 tablet by mouth daily.     oxyCODONE-acetaminophen 5-325 MG tablet  Commonly known as:  PERCOCET/ROXICET  Take 1-2 tablets by mouth every 4 (four) hours as needed for moderate pain (moderate to severe pain).     SILICA PO  Take by mouth every morning.     vitamin C 100 MG tablet  Take 500 mg by mouth daily.       Follow-up Information    Follow up with Donaciano Eva, MD On 04/23/2015.   Specialty:  Obstetrics and Gynecology   Why:  at 1:45pm at the [facility] information:   [place] [address] [phone]       Greater than thirty minutes were spend for face to face discharge  instructions and discharge orders/summary in EPIC.   Signed: CROSS, MELISSA DEAL 04/04/2015, 9:22 AM

## 2015-04-06 ENCOUNTER — Telehealth: Payer: Self-pay | Admitting: Gynecologic Oncology

## 2015-04-06 NOTE — Telephone Encounter (Signed)
Informed patient of stage IA cancer. Low risk for recurrence. No indication for adjuvant therapy.  Donaciano Eva, MD

## 2015-04-13 ENCOUNTER — Other Ambulatory Visit: Payer: Self-pay

## 2015-04-13 ENCOUNTER — Other Ambulatory Visit (HOSPITAL_BASED_OUTPATIENT_CLINIC_OR_DEPARTMENT_OTHER): Payer: BC Managed Care – PPO

## 2015-04-13 ENCOUNTER — Telehealth: Payer: Self-pay

## 2015-04-13 DIAGNOSIS — C541 Malignant neoplasm of endometrium: Secondary | ICD-10-CM

## 2015-04-13 DIAGNOSIS — R300 Dysuria: Secondary | ICD-10-CM

## 2015-04-13 LAB — URINALYSIS, MICROSCOPIC - CHCC
BILIRUBIN (URINE): NEGATIVE
BLOOD: NEGATIVE
Glucose: NEGATIVE mg/dL
Ketones: NEGATIVE mg/dL
LEUKOCYTE ESTERASE: NEGATIVE
Nitrite: NEGATIVE
PH: 7.5 (ref 4.6–8.0)
Protein: NEGATIVE mg/dL
SPECIFIC GRAVITY, URINE: 1.005 (ref 1.003–1.035)
UROBILINOGEN UR: 0.2 mg/dL (ref 0.2–1)

## 2015-04-13 NOTE — Telephone Encounter (Signed)
Patient called with possible UTI , Dr Everitt Amber aware , orders received to have the patient come in today for a U/A with culture. Patient scheduled for 12 noon to come , patient aware that we will contact her with the results . Patient denies further questions and or concerns at this time , will call with additional changes.

## 2015-04-14 LAB — URINE CULTURE

## 2015-04-17 NOTE — Telephone Encounter (Signed)
Orders received to contact the patient to update with urine culture results are "NEG" for UTI . Patient contacted and updated , patient states understanding , denies further questions at this time.

## 2015-04-23 ENCOUNTER — Ambulatory Visit: Payer: BC Managed Care – PPO | Attending: Gynecologic Oncology | Admitting: Gynecologic Oncology

## 2015-04-23 ENCOUNTER — Encounter: Payer: Self-pay | Admitting: Gynecologic Oncology

## 2015-04-23 VITALS — BP 127/74 | HR 64 | Temp 98.5°F | Resp 19 | Wt 216.8 lb

## 2015-04-23 DIAGNOSIS — R0602 Shortness of breath: Secondary | ICD-10-CM | POA: Diagnosis not present

## 2015-04-23 DIAGNOSIS — D509 Iron deficiency anemia, unspecified: Secondary | ICD-10-CM | POA: Diagnosis not present

## 2015-04-23 DIAGNOSIS — Z90710 Acquired absence of both cervix and uterus: Secondary | ICD-10-CM | POA: Diagnosis present

## 2015-04-23 DIAGNOSIS — Z08 Encounter for follow-up examination after completed treatment for malignant neoplasm: Secondary | ICD-10-CM | POA: Insufficient documentation

## 2015-04-23 DIAGNOSIS — C541 Malignant neoplasm of endometrium: Secondary | ICD-10-CM | POA: Diagnosis not present

## 2015-04-23 NOTE — Progress Notes (Signed)
ENDOMETRIAL CANCER FOLLOW-UP  Assessment:    50 y.o. year old with Stage IA Grade 1 endometrioid endometrial cancer.   S/p robotic assisted total hysterectomy, BSO, sentinel lymph node biopsy on 04/03/15. no LVSI, no myometrial invasion, negative pelvic washings and negative lymph nodes.   Plan: 1) Pathology reports reviewed today 2) Treatment counseling - Very low risk (<5%) for recurrence given age, grade, depth of myometrial invasion and LVSI status. Multidisciplinary tumor board recommendation is for routine surveillance with frequent pelvic exams and visits with annual pap smear.  We will start with visits every 6 months x 5 years, at which time she can return to annual visits.  Discussed signs and symptoms of recurrence including vaginal bleeding or discharge, leg pain or swelling and changes in bowel or bladder habits. She was given the opportunity to ask questions, which were answered to her satisfaction, and she is agreement with the above mentioned plan of care.  3)  Return to clinic in 6 months (August, 2017) to see me, and in 12 months to see Dr Matthew Saras (February 2018)  HPI:  Suzanne [name] is a 50 y.o. year old G0 initially seen in consultation on 03/15/15 referred by Dr Matthew Saras for grade 1 endometrial cancer.  She then underwent a robotic assisted total hysterectomy, BSO and sentinel lymph node biopsy on [id number] without complications.  Her postoperative course was uncomplicated.  Her final pathologic diagnosis is a Stage IA Grade 1 endometrioid endometrial cancer with no lymphovascular space invasion, no myometrial invasion and negative lymph nodes.  She is seen today for a postoperative check and to discuss her pathology results and treatment plan.  Since discharge from the hospital, she is feeling well.  She has improving appetite, normal bowel and bladder function, and pain controlled with minimal PO medication. She has no other complaints today.  Past Medical History  Diagnosis  Date  . Ovarian tumor (benign)   . Shortness of breath dyspnea     rare times"feels like I get short of breath"-? related to anemia  . Anemia     iron supplement used occ.  . Cancer [facility])     endometrial cancer dx.   Past Surgical History  Procedure Laterality Date  . Ovarian tumor removal      age 51 yrs old- [employer]  . Robotic assisted total hysterectomy with bilateral salpingo oopherectomy Bilateral 04/03/2015    Procedure: XI ROBOTIC ASSISTED TOTAL HYSTERECTOMY WITH BILATERAL SALPINGO OOPHORECTOMY WITH SENTINAL LYMPH NODE BIOPSY;  Surgeon: Everitt Amber, MD;  Location: [facility] ORS;  Service: Gynecology;  Laterality: Bilateral;   Family History  Problem Relation Age of Onset  . Arthritis Mother    Social History   Social History  . Marital Status: Widowed    Spouse Name: N/A  . Number of Children: N/A  . Years of Education: N/A   Occupational History  . Not on file.   Social History Main Topics  . Smoking status: Never Smoker   . Smokeless tobacco: Not on file  . Alcohol Use: 0.0 oz/week     Comment: OCCASIONAL  . Drug Use: No     Comment: none in 2 yrs-"don't expect to use again"  . Sexual Activity: Not on file   Other Topics Concern  . Not on file   Social History Narrative   No Known Allergies Current Outpatient Prescriptions on File Prior to Visit  Medication Sig Dispense Refill  . Ascorbic Acid (VITAMIN C) 100 MG tablet Take 500 mg by mouth  daily.     . b complex vitamins tablet Take 1 tablet by mouth daily.    Marland Kitchen BIOTIN PO Take 1,000 mcg by mouth every morning.    Marland Kitchen ibuprofen (ADVIL,MOTRIN) 200 MG tablet Take 200 mg by mouth every 6 (six) hours as needed for moderate pain.    . Methylsulfonylmethane (MSM PO) Take 1 tablet by mouth daily.    . Multiple Vitamin (MULTIVITAMIN) tablet Take 1 tablet by mouth daily.    . Nutritional Supplements (SILICA PO) Take by mouth every morning.    Marland Kitchen Specialty Vitamins Products (BILBERRY PLUS PO) Take 1 tablet by mouth daily.      No current facility-administered medications on file prior to visit.     Review of systems: Constitutional:  She has no weight gain or weight loss. She has no fever or chills. Eyes: No blurred vision Ears, Nose, Mouth, Throat: No dizziness, headaches or changes in hearing. No mouth sores. Cardiovascular: No chest pain, palpitations or edema. Respiratory:  No shortness of breath, wheezing or cough Gastrointestinal: She has normal bowel movements without diarrhea or constipation. She denies any nausea or vomiting. She denies blood in her stool or heart burn. Genitourinary:  She denies pelvic pain, pelvic pressure or changes in her urinary function. She has no hematuria, dysuria, or incontinence. She has no irregular vaginal bleeding or vaginal discharge Musculoskeletal: Denies muscle weakness or joint pains.  Skin:  She has no skin changes, rashes or itching Neurological:  Denies dizziness or headaches. No neuropathy, no numbness or tingling. Psychiatric:  She denies depression or anxiety. Hematologic/Lymphatic:   No easy bruising or bleeding   Physical Exam: Blood pressure 127/74, pulse 64, temperature 98.5 F (36.9 C), temperature source Oral, resp. rate 19, weight 216 lb 12.8 oz (98.34 kg), last menstrual period 02/27/2015, SpO2 100 %. General: Well dressed, well nourished in no apparent distress.   HEENT:  Normocephalic and atraumatic, no lesions.  Extraocular muscles intact. Sclerae anicteric. Pupils equal, round, reactive. No mouth sores or ulcers. Thyroid is normal size, not nodular, midline. Abdomen:  Soft, nontender, nondistended.  No palpable masses.  No hepatosplenomegaly.  No ascites. Normal bowel sounds.  No hernias.  Incisions are well healed Genitourinary: Normal EGBUS  Vaginal cuff intact.  No bleeding or discharge.  No cul de sac fullness. Extremities: No cyanosis, clubbing or edema.  No calf tenderness or erythema. No palpable cords. Psychiatric: Mood and affect are  appropriate. Neurological: Awake, alert and oriented x 3. Sensation is intact, no neuropathy.  Musculoskeletal: No pain, normal strength and range of motion.   30 minutes of direct face to face counseling time was spent with the patient. This included discussion about prognosis, therapy recommendations and postoperative side effects and are beyond the scope of routine postoperative care.  Donaciano Eva, MD

## 2015-04-23 NOTE — Patient Instructions (Signed)
Follow up with Dr Everitt Amber in August 2017 , in June to schedule your follow up appointment .  Thank you !

## 2015-10-05 ENCOUNTER — Other Ambulatory Visit: Payer: Self-pay | Admitting: Family Medicine

## 2015-10-05 ENCOUNTER — Ambulatory Visit
Admission: RE | Admit: 2015-10-05 | Discharge: 2015-10-05 | Disposition: A | Discharge status: Home / Self Care | Payer: BC Managed Care – PPO | Source: Ambulatory Visit | Attending: Family Medicine | Admitting: Family Medicine

## 2015-10-05 DIAGNOSIS — M130 Polyarthritis, unspecified: Secondary | ICD-10-CM

## 2015-10-05 DIAGNOSIS — R52 Pain, unspecified: Secondary | ICD-10-CM

## 2015-11-28 ENCOUNTER — Encounter: Payer: Self-pay | Admitting: Gynecologic Oncology

## 2015-11-28 ENCOUNTER — Ambulatory Visit: Payer: BC Managed Care – PPO | Attending: Gynecologic Oncology | Admitting: Gynecologic Oncology

## 2015-11-28 VITALS — BP 139/78 | HR 68 | Temp 98.7°F | Resp 18 | Wt 230.2 lb

## 2015-11-28 DIAGNOSIS — Z90710 Acquired absence of both cervix and uterus: Secondary | ICD-10-CM | POA: Diagnosis not present

## 2015-11-28 DIAGNOSIS — Z8542 Personal history of malignant neoplasm of other parts of uterus: Secondary | ICD-10-CM | POA: Diagnosis not present

## 2015-11-28 DIAGNOSIS — E119 Type 2 diabetes mellitus without complications: Secondary | ICD-10-CM | POA: Diagnosis not present

## 2015-11-28 DIAGNOSIS — C541 Malignant neoplasm of endometrium: Secondary | ICD-10-CM

## 2015-11-28 DIAGNOSIS — D649 Anemia, unspecified: Secondary | ICD-10-CM | POA: Diagnosis not present

## 2015-11-28 NOTE — Progress Notes (Signed)
ENDOMETRIAL CANCER FOLLOW-UP  Assessment:    50 y.o. year old with Stage IA Grade 1 endometrioid endometrial cancer.   S/p robotic assisted total hysterectomy, BSO, sentinel lymph node biopsy on 04/03/15. Low risk features on pathology (no LVSI, no myometrial invasion, negative pelvic washings and negative lymph nodes). No adjuvant therapy recommended.   Plan: 1)Endometrial cancer: no evidence of recurrence on today's exam. We will continue with visits every 6 months x 5 years, at which time she can return to annual visits.  Discussed signs and symptoms of recurrence including vaginal bleeding or discharge, leg pain or swelling and changes in bowel or bladder habits. She was given the opportunity to ask questions, which were answered to her satisfaction, and she is agreement with the above mentioned plan of care. 2) diabetes mellitus: type 2, non insulin dependent: continue care with PCP. Facilitated referral to diabetes educator regarding diet. 3)  Return to clinic in 6 months to see Dr Matthew Saras, and in 12 months to see me.  HPI:  Suzanne [name] is a 50 y.o. year old G0 initially seen in consultation on 03/15/15 referred by Dr Matthew Saras for grade 1 endometrial cancer.  She then underwent a robotic assisted total hysterectomy, BSO and sentinel lymph node biopsy on [id number] without complications.  Her postoperative course was uncomplicated.  Her final pathologic diagnosis is a Stage IA Grade 1 endometrioid endometrial cancer with no lymphovascular space invasion, no myometrial invasion and negative lymph nodes.  Interval Hx: She is seen today for a routine evaluation. This week she was diagnosed with type II diabetes and was started on a new medication (she cannot remember name). Her blood glucose was 160. She is interested in meeting with a dietician. She denies vaginal bleeding, LE edema, pelvic or back pain.  Past Medical History:  Diagnosis Date  . Anemia    iron supplement used occ.  .  Cancer [facility])    endometrial cancer dx.  . Ovarian tumor (benign)   . Shortness of breath dyspnea    rare times"feels like I get short of breath"-? related to anemia   Past Surgical History:  Procedure Laterality Date  . OVARIAN TUMOR REMOVAL     age 12 yrs old- [employer]  . ROBOTIC ASSISTED TOTAL HYSTERECTOMY WITH BILATERAL SALPINGO OOPHERECTOMY Bilateral 04/03/2015   Procedure: XI ROBOTIC ASSISTED TOTAL HYSTERECTOMY WITH BILATERAL SALPINGO OOPHORECTOMY WITH SENTINAL LYMPH NODE BIOPSY;  Surgeon: Everitt Amber, MD;  Location: [facility];  Service: Gynecology;  Laterality: Bilateral;   Family History  Problem Relation Age of Onset  . Arthritis Mother    Social History   Social History  . Marital status: Widowed    Spouse name: N/A  . Number of children: N/A  . Years of education: N/A   Occupational History  . Not on file.   Social History Main Topics  . Smoking status: Never Smoker  . Smokeless tobacco: Never Used  . Alcohol use 0.0 oz/week     Comment: OCCASIONAL  . Drug use: No     Comment: none in 2 yrs-"don't expect to use again"  . Sexual activity: Not on file   Other Topics Concern  . Not on file   Social History Narrative  . No narrative on file   No Known Allergies Current Outpatient Prescriptions on File Prior to Visit  Medication Sig Dispense Refill  . Ascorbic Acid (VITAMIN C) 100 MG tablet Take 500 mg by mouth daily.     Marland Kitchen b complex vitamins tablet  Take 1 tablet by mouth daily.    Marland Kitchen BIOTIN PO Take 1,000 mcg by mouth every morning.    Marland Kitchen ibuprofen (ADVIL,MOTRIN) 200 MG tablet Take 200 mg by mouth every 6 (six) hours as needed for moderate pain.    . Methylsulfonylmethane (MSM PO) Take 1 tablet by mouth daily.    . Multiple Vitamin (MULTIVITAMIN) tablet Take 1 tablet by mouth daily.    . Nutritional Supplements (SILICA PO) Take by mouth every morning.    Marland Kitchen Specialty Vitamins Products (BILBERRY PLUS PO) Take 1 tablet by mouth daily.     No current  facility-administered medications on file prior to visit.      Review of systems: Constitutional:  She has no weight gain or weight loss. She has no fever or chills. Eyes: No blurred vision Ears, Nose, Mouth, Throat: No dizziness, headaches or changes in hearing. No mouth sores. Cardiovascular: No chest pain, palpitations or edema. Respiratory:  No shortness of breath, wheezing or cough Gastrointestinal: She has normal bowel movements without diarrhea or constipation. She denies any nausea or vomiting. She denies blood in her stool or heart burn. Genitourinary:  She denies pelvic pain, pelvic pressure or changes in her urinary function. She has no hematuria, dysuria, or incontinence. She has no irregular vaginal bleeding or vaginal discharge Musculoskeletal: Denies muscle weakness or joint pains.  Skin:  She has no skin changes, rashes or itching Neurological:  Denies dizziness or headaches. No neuropathy, no numbness or tingling. Psychiatric:  She denies depression or anxiety. Hematologic/Lymphatic:   No easy bruising or bleeding   Physical Exam: Blood pressure 139/78, pulse 68, temperature 98.7 F (37.1 C), temperature source Oral, resp. rate 18, weight 230 lb 3.2 oz (104.4 kg), SpO2 99 %. General: Well dressed, well nourished in no apparent distress.   HEENT:  Normocephalic and atraumatic, no lesions.  Extraocular muscles intact. Sclerae anicteric. Pupils equal, round, reactive. No mouth sores or ulcers. Thyroid is normal size, not nodular, midline. Abdomen:  Soft, nontender, nondistended.  No palpable masses.  No hepatosplenomegaly.  No ascites. Normal bowel sounds.  No hernias.  Incisions are well healed Genitourinary: Normal EGBUS  Vaginal cuff intact.  No bleeding or discharge.  No cul de sac fullness. Extremities: No cyanosis, clubbing or edema.  No calf tenderness or erythema. No palpable cords. Psychiatric: Mood and affect are appropriate. Neurological: Awake, alert and oriented  x 3. Sensation is intact, no neuropathy.  Musculoskeletal: No pain, normal strength and range of motion.   Donaciano Eva, MD

## 2015-11-28 NOTE — Patient Instructions (Signed)
Plan to follow up with Dr Molli Posey in 6 months and follow up with Dr Everitt Amber in one year. Call our office after your appointment with Dr Matthew Saras to schedule your follow up with Dr Denman George. Please call with any changes , questions or concerns. We will place a referral to dietary and call you with an appointment.  Thank you

## 2015-11-30 ENCOUNTER — Encounter: Payer: BC Managed Care – PPO | Admitting: Neurology

## 2016-06-04 ENCOUNTER — Encounter: Payer: Self-pay | Admitting: Gynecologic Oncology

## 2016-11-26 ENCOUNTER — Telehealth: Payer: Self-pay | Admitting: *Deleted

## 2016-11-26 NOTE — Telephone Encounter (Signed)
Returned patient's call and LMOM to call the office back.

## 2016-12-10 ENCOUNTER — Telehealth: Payer: Self-pay | Admitting: *Deleted

## 2016-12-10 NOTE — Telephone Encounter (Signed)
Returned patient's call and scheduled a follow up appt for October 29th at 3:30pm

## 2016-12-17 ENCOUNTER — Telehealth: Payer: Self-pay | Admitting: *Deleted

## 2016-12-17 NOTE — Telephone Encounter (Signed)
Attempted to contact the patient to move October 29th appt form the afternoon to the morning. LMOM for the patient to call the office back.

## 2016-12-18 ENCOUNTER — Telehealth: Payer: Self-pay | Admitting: *Deleted

## 2016-12-18 NOTE — Telephone Encounter (Signed)
Patient returned call from yesterday. Moved patient's appt from October 29th to November 7th.

## 2017-01-05 ENCOUNTER — Ambulatory Visit: Payer: BC Managed Care – PPO | Admitting: Gynecologic Oncology

## 2017-01-13 ENCOUNTER — Telehealth: Payer: Self-pay | Admitting: *Deleted

## 2017-01-13 NOTE — Telephone Encounter (Signed)
Patient called and moved her appt from tomorrow to November 21st.

## 2017-01-14 ENCOUNTER — Ambulatory Visit: Payer: BC Managed Care – PPO | Admitting: Gynecologic Oncology

## 2017-01-28 ENCOUNTER — Encounter: Payer: Self-pay | Admitting: Gynecologic Oncology

## 2017-01-28 ENCOUNTER — Ambulatory Visit: Payer: BC Managed Care – PPO | Attending: Gynecologic Oncology | Admitting: Gynecologic Oncology

## 2017-01-28 VITALS — BP 128/63 | HR 65 | Temp 98.3°F | Resp 18 | Ht 64.0 in | Wt 225.3 lb

## 2017-01-28 DIAGNOSIS — Z9889 Other specified postprocedural states: Secondary | ICD-10-CM | POA: Diagnosis not present

## 2017-01-28 DIAGNOSIS — Z8542 Personal history of malignant neoplasm of other parts of uterus: Secondary | ICD-10-CM

## 2017-01-28 DIAGNOSIS — Z794 Long term (current) use of insulin: Secondary | ICD-10-CM | POA: Insufficient documentation

## 2017-01-28 DIAGNOSIS — Z90722 Acquired absence of ovaries, bilateral: Secondary | ICD-10-CM | POA: Diagnosis not present

## 2017-01-28 DIAGNOSIS — Z90710 Acquired absence of both cervix and uterus: Secondary | ICD-10-CM | POA: Diagnosis not present

## 2017-01-28 DIAGNOSIS — Z8261 Family history of arthritis: Secondary | ICD-10-CM | POA: Diagnosis not present

## 2017-01-28 DIAGNOSIS — Z79899 Other long term (current) drug therapy: Secondary | ICD-10-CM | POA: Diagnosis not present

## 2017-01-28 DIAGNOSIS — C541 Malignant neoplasm of endometrium: Secondary | ICD-10-CM

## 2017-01-28 DIAGNOSIS — E119 Type 2 diabetes mellitus without complications: Secondary | ICD-10-CM | POA: Diagnosis not present

## 2017-01-28 NOTE — Progress Notes (Signed)
ENDOMETRIAL CANCER FOLLOW-UP  Assessment:    51 y.o. year old with Stage IA Grade 1 endometrioid endometrial cancer.   S/p robotic assisted total hysterectomy, BSO, sentinel lymph node biopsy on 04/03/15. Low risk features on pathology (no LVSI, no myometrial invasion, negative pelvic washings and negative lymph nodes). No adjuvant therapy recommended.  Plan: 1)Endometrial cancer: no evidence of recurrence on today's exam. We will continue with visits every 6 months x 5 years, at which time she can return to annual visits.  Discussed signs and symptoms of recurrence including vaginal bleeding or discharge, leg pain or swelling and changes in bowel or bladder habits. She was given the opportunity to ask questions, which were answered to her satisfaction, and she is agreement with the above mentioned plan of care. 2) diabetes mellitus: type 2, non insulin dependent: continue care with PCP.  3)  Return to clinic in 6 months to see Dr Matthew Saras, and in 12 months to see me. 4) breast changes - informed patient that I do not appreciate masses or abnormalities, but she should contact the mammography center to schedule her past-due mammogram. She should contact Dr Matthew Saras if she notes new breast changes.   HPI:  Suzanne [name] is a 51 y.o. year old G0 initially seen in consultation on 03/15/15 referred by Dr Matthew Saras for grade 1 endometrial cancer.  She then underwent a robotic assisted total hysterectomy, BSO and sentinel lymph node biopsy on 09/06/50 without complications.  Her postoperative course was uncomplicated.  Her final pathologic diagnosis is a Stage IA Grade 1 endometrioid endometrial cancer with no lymphovascular space invasion, no myometrial invasion and negative lymph nodes.  Interval Hx: She is seen today for a routine evaluation. She is interested in meeting with a dietician. She denies vaginal bleeding, LE edema, pelvic or back pain. She does report an inability to "hear bass music" since  her hysterectomy.  She also reports low libido.   Past Medical History:  Diagnosis Date  . Anemia    iron supplement used occ.  . Cancer [facility])    endometrial cancer dx.  . Ovarian tumor (benign)   . Shortness of breath dyspnea    rare times"feels like I get short of breath"-? related to anemia   Past Surgical History:  Procedure Laterality Date  . OVARIAN TUMOR REMOVAL     age 33 yrs old- [employer]  . ROBOTIC ASSISTED TOTAL HYSTERECTOMY WITH BILATERAL SALPINGO OOPHERECTOMY Bilateral 04/03/2015   Procedure: XI ROBOTIC ASSISTED TOTAL HYSTERECTOMY WITH BILATERAL SALPINGO OOPHORECTOMY WITH SENTINAL LYMPH NODE BIOPSY;  Surgeon: Everitt Amber, MD;  Location: [facility];  Service: Gynecology;  Laterality: Bilateral;   Family History  Problem Relation Age of Onset  . Arthritis Mother    Social History   Socioeconomic History  . Marital status: Widowed    Spouse name: Not on file  . Number of children: Not on file  . Years of education: Not on file  . Highest education level: Not on file  Social Needs  . Financial resource strain: Not on file  . Food insecurity - worry: Not on file  . Food insecurity - inability: Not on file  . Transportation needs - medical: Not on file  . Transportation needs - non-medical: Not on file  Occupational History  . Not on file  Tobacco Use  . Smoking status: Never Smoker  . Smokeless tobacco: Never Used  Substance and Sexual Activity  . Alcohol use: Yes    Alcohol/week: 0.0 oz  Comment: OCCASIONAL  . Drug use: No    Comment: none in 2 yrs-"don't expect to use again"  . Sexual activity: Not on file  Other Topics Concern  . Not on file  Social History Narrative  . Not on file   No Known Allergies Current Outpatient Medications on File Prior to Visit  Medication Sig Dispense Refill  . Ascorbic Acid (VITAMIN C) 100 MG tablet Take 500 mg by mouth daily.     Marland Kitchen b complex vitamins tablet Take 1 tablet by mouth daily.    Marland Kitchen BIOTIN PO Take 1,000 mcg by  mouth every morning.    Marland Kitchen ibuprofen (ADVIL,MOTRIN) 200 MG tablet Take 200 mg by mouth every 6 (six) hours as needed for moderate pain.    . Methylsulfonylmethane (MSM PO) Take 1 tablet by mouth daily.    . Multiple Vitamin (MULTIVITAMIN) tablet Take 1 tablet by mouth daily.    . Nutritional Supplements (SILICA PO) Take by mouth every morning.    Marland Kitchen Specialty Vitamins Products (BILBERRY PLUS PO) Take 1 tablet by mouth daily.     No current facility-administered medications on file prior to visit.      Review of systems: Constitutional:  She has no weight gain or weight loss. She has no fever or chills. Eyes: No blurred vision Ears, Nose, Mouth, Throat: No dizziness, headaches or changes in hearing. No mouth sores. Cardiovascular: No chest pain, palpitations or edema. Respiratory:  No shortness of breath, wheezing or cough Gastrointestinal: She has normal bowel movements without diarrhea or constipation. She denies any nausea or vomiting. She denies blood in her stool or heart burn. Genitourinary:  She denies pelvic pain, pelvic pressure or changes in her urinary function. She has no hematuria, dysuria, or incontinence. She has no irregular vaginal bleeding or vaginal discharge Musculoskeletal: Denies muscle weakness or joint pains.  Skin:  She has no skin changes, rashes or itching Neurological:  Denies dizziness or headaches. No neuropathy, no numbness or tingling. Psychiatric:  She denies depression or anxiety. Hematologic/Lymphatic:   No easy bruising or bleeding   Physical Exam: There were no vitals taken for this visit. General: Well dressed, well nourished in no apparent distress.   HEENT:  Normocephalic and atraumatic, no lesions.  Extraocular muscles intact. Sclerae anicteric. Pupils equal, round, reactive. No mouth sores or ulcers. Thyroid is normal size, not nodular, midline. Breast:: no palpable masses or skin changes Abdomen:  Soft, nontender, nondistended.  No palpable  masses.  No hepatosplenomegaly.  No ascites. Normal bowel sounds.  No hernias.  Incisions are well healed Genitourinary: Normal EGBUS  Vaginal cuff intact.  No bleeding or discharge.  No cul de sac fullness. Extremities: No cyanosis, clubbing or edema.  No calf tenderness or erythema. No palpable cords. Psychiatric: Mood and affect are appropriate. Neurological: Awake, alert and oriented x 3. Sensation is intact, no neuropathy.  Musculoskeletal: No pain, normal strength and range of motion.   Donaciano Eva, MD

## 2017-01-28 NOTE — Patient Instructions (Addendum)
Please notify Dr Denman George at phone number [phone] if you notice vaginal bleeding, new pelvic or abdominal pains, bloating, feeling full easy, or a change in bladder or bowel function.   Please return to see Dr Matthew Saras in May, 2019 and Dr Denman George in November, 2019. Please call Dr Serita Grit office in the summer to schedule that appointment.  Notify Dr Matthew Saras if you notice new breast changes.  Please contact the mammography center to schedule your mammogram.

## 2017-07-29 ENCOUNTER — Ambulatory Visit: Payer: BC Managed Care – PPO | Admitting: Gynecologic Oncology

## 2017-09-24 IMAGING — CR DG CHEST 2V
2 series · 2 of 2 positions shown · non-contrast
Comparison: 07/24/2007

CLINICAL DATA: Intermittent shortness of breath, history of
endometrial cancer

EXAM:
CHEST  2 VIEW

[w chest pa]
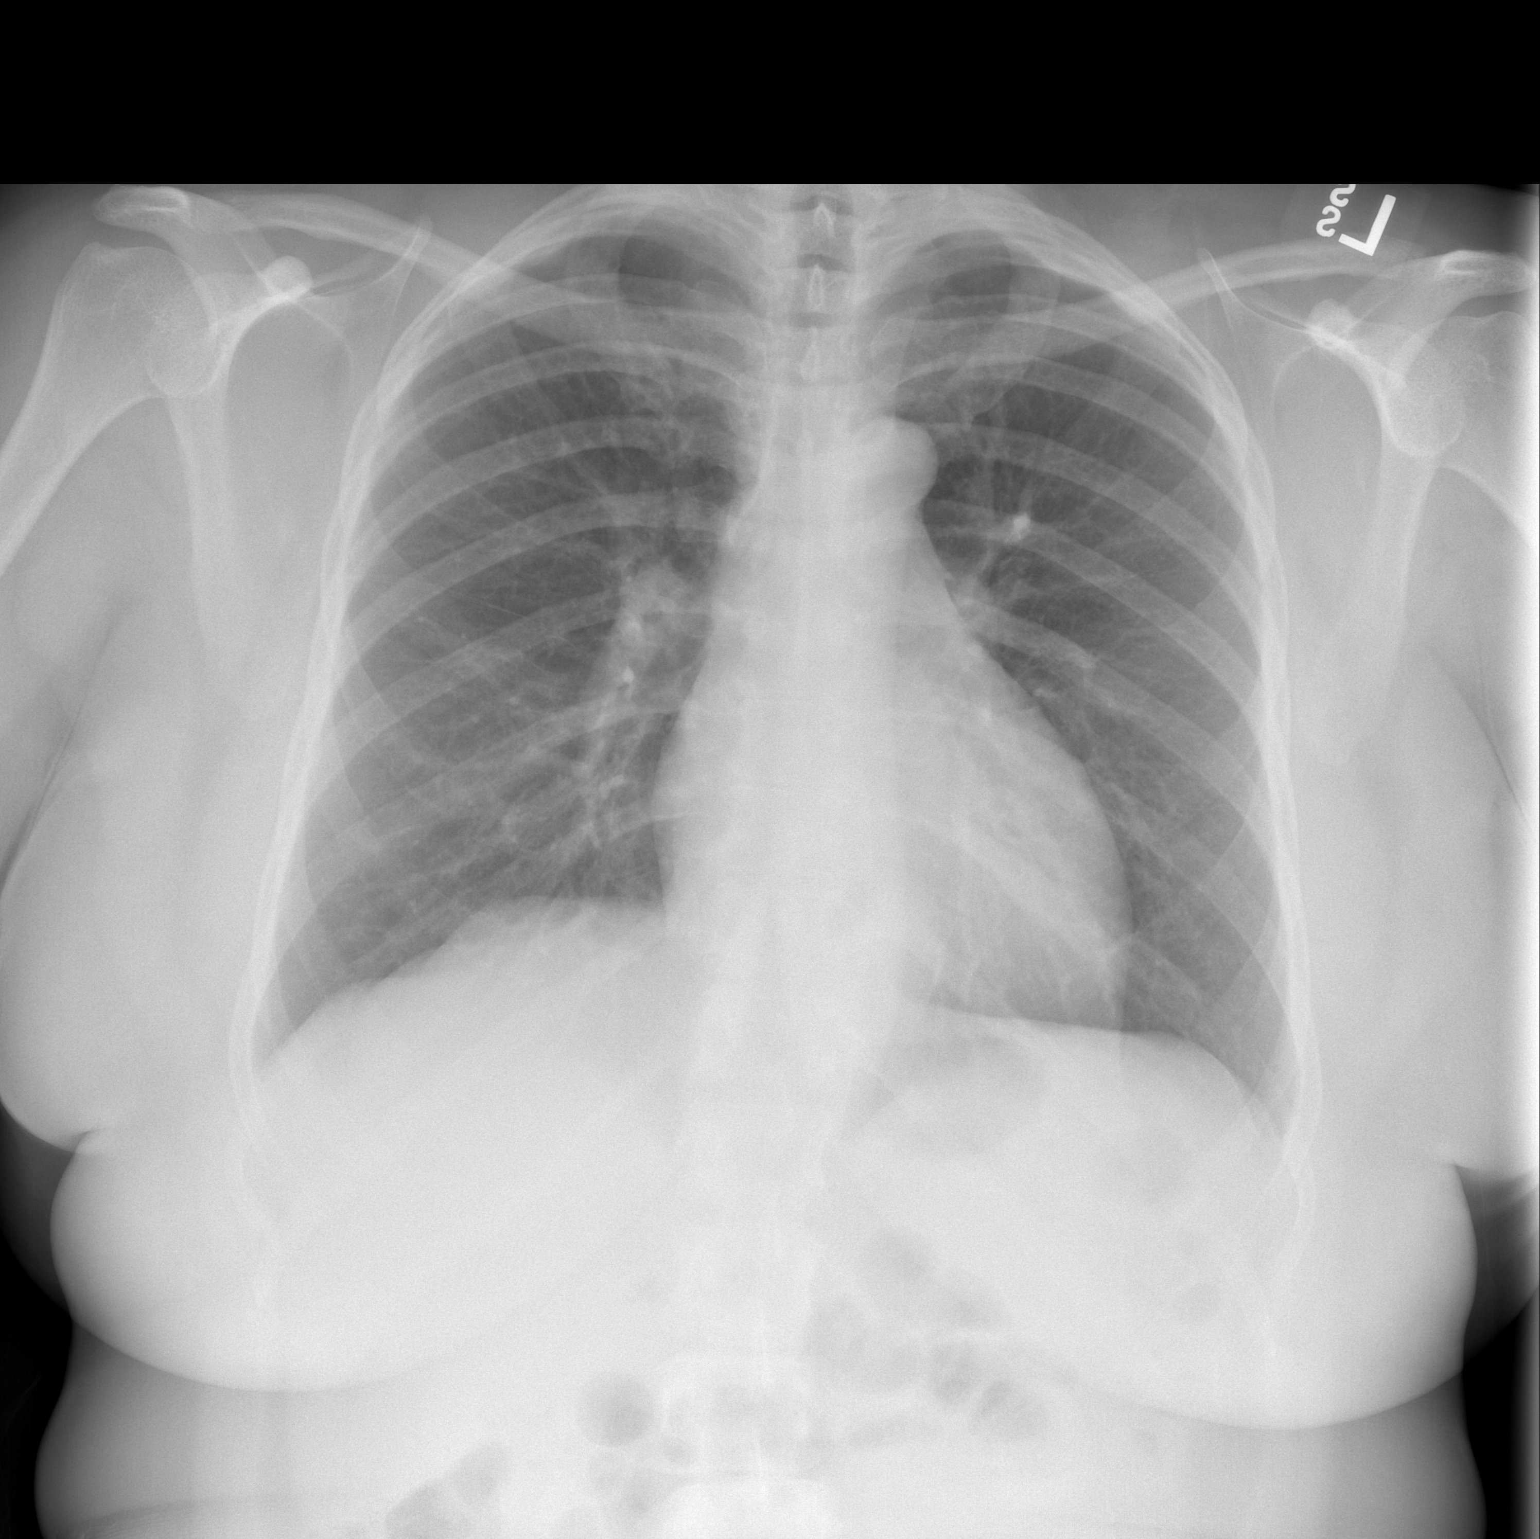

[w chest lat]
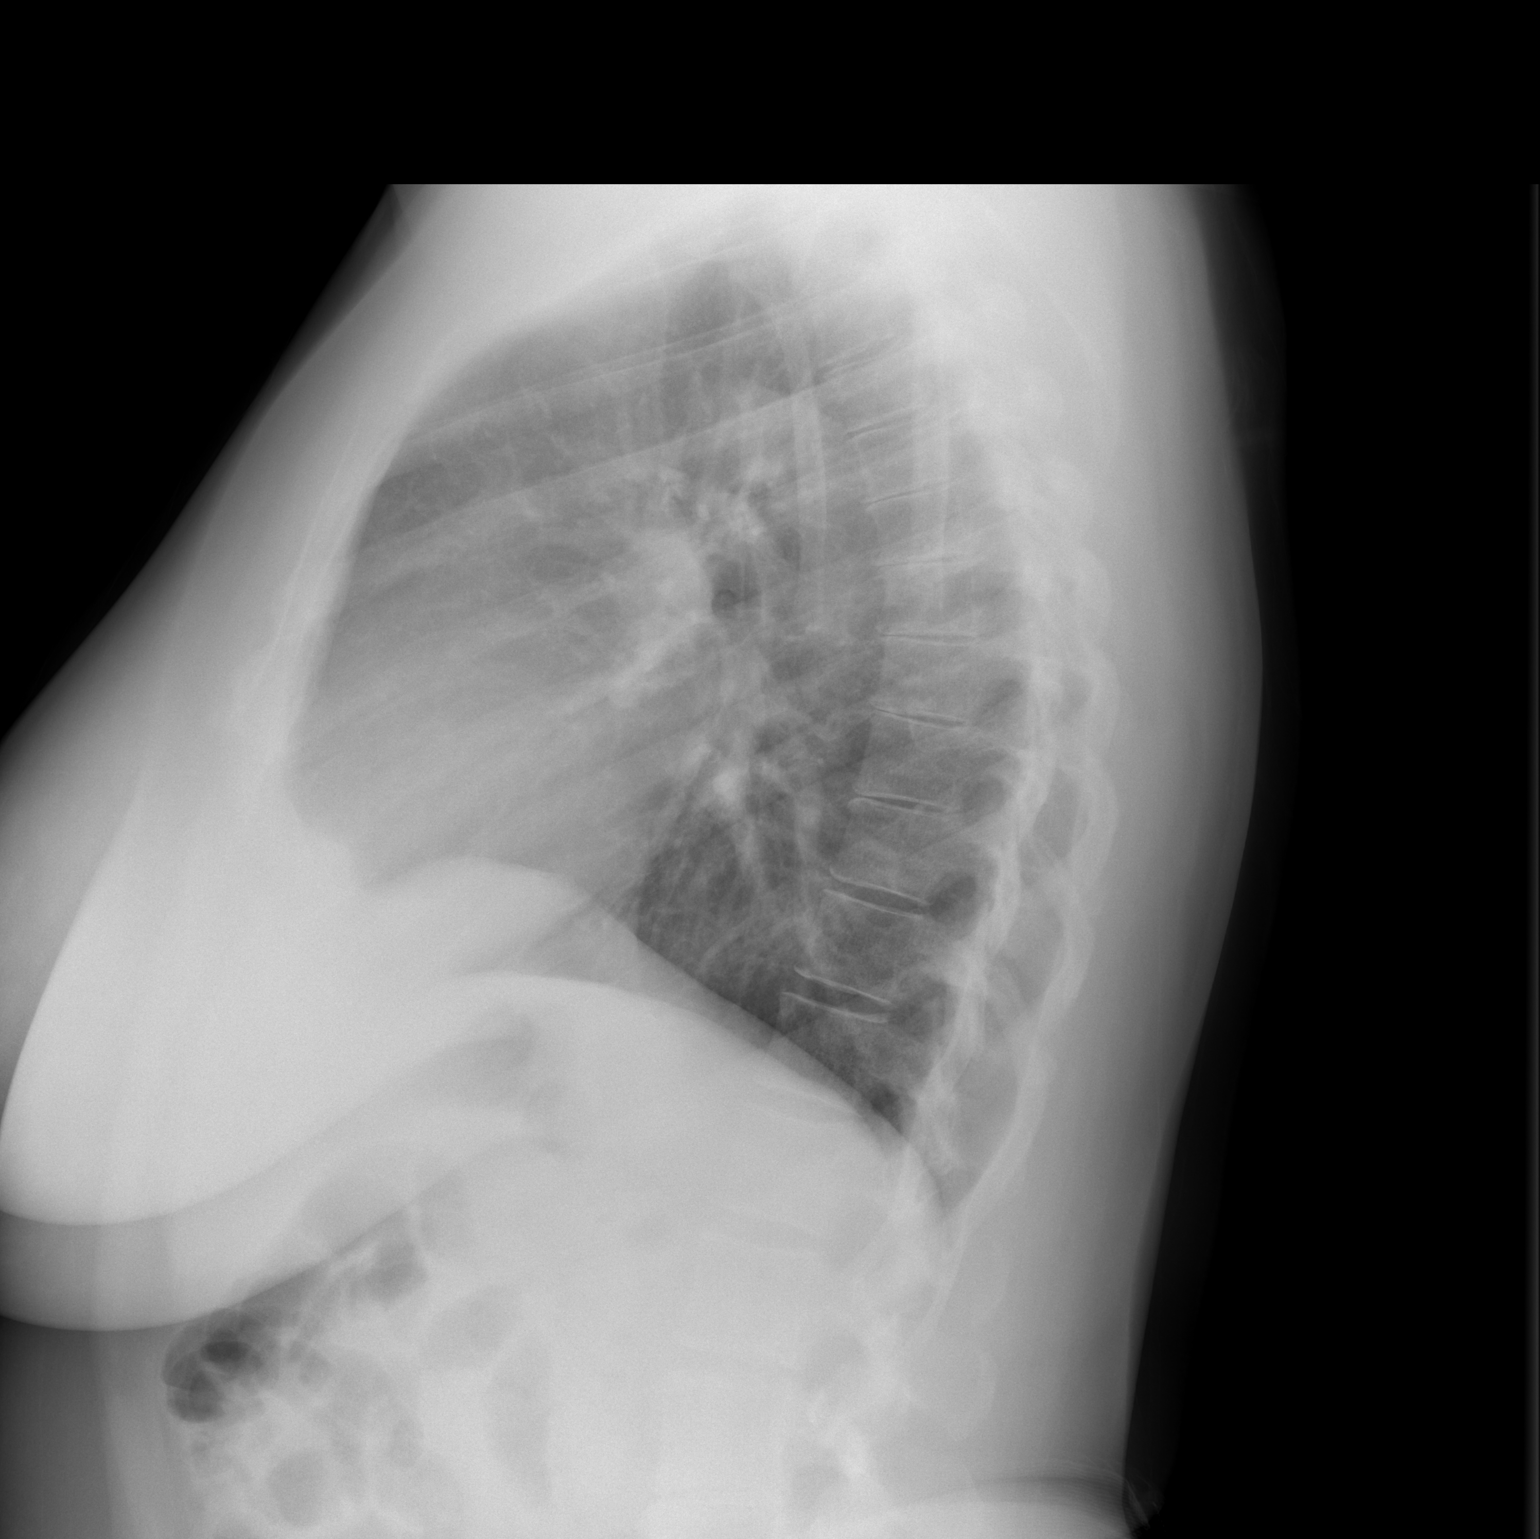

[2 of 2 positions shown; findings below may reference images not displayed]

FINDINGS: The heart size and mediastinal contours are within normal limits.
Both lungs are clear. The visualized skeletal structures are
unremarkable.
IMPRESSION: No active cardiopulmonary disease.

## 2018-04-02 IMAGING — CR DG KNEE COMPLETE 4+V*L*
4 series · 4 of 4 positions shown · non-contrast
Comparison: None.

CLINICAL DATA: Left knee pain for several months after fall.

EXAM:
LEFT KNEE - COMPLETE 4+ VIEW

[w knee ap left]
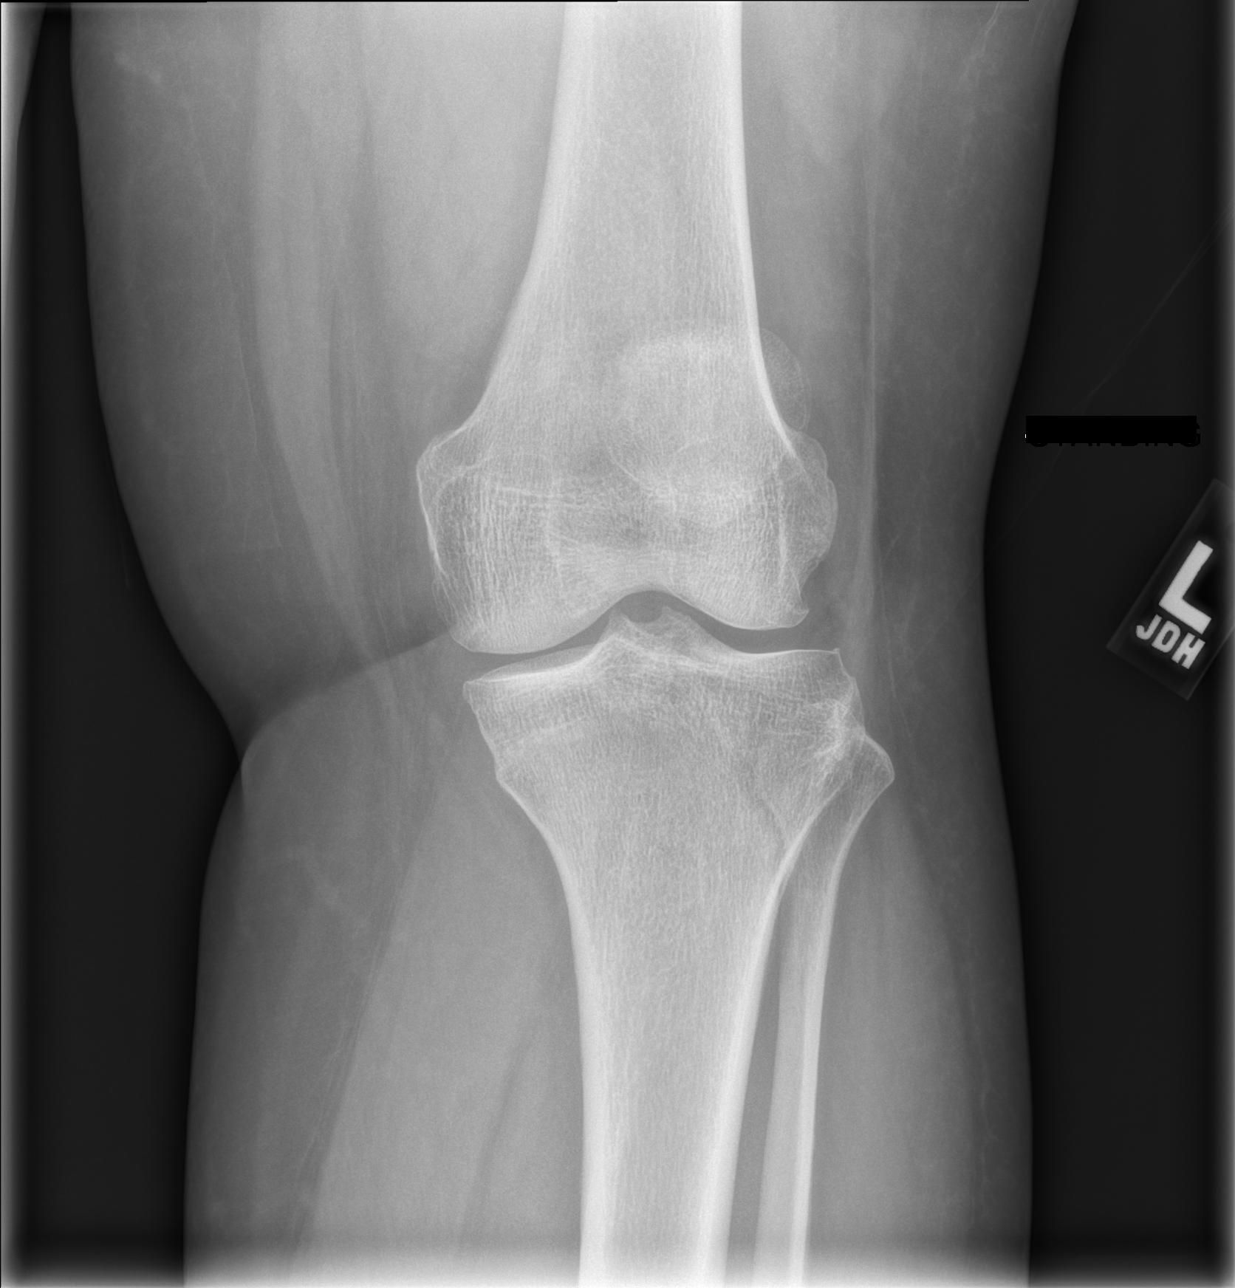

[w knee lat left]
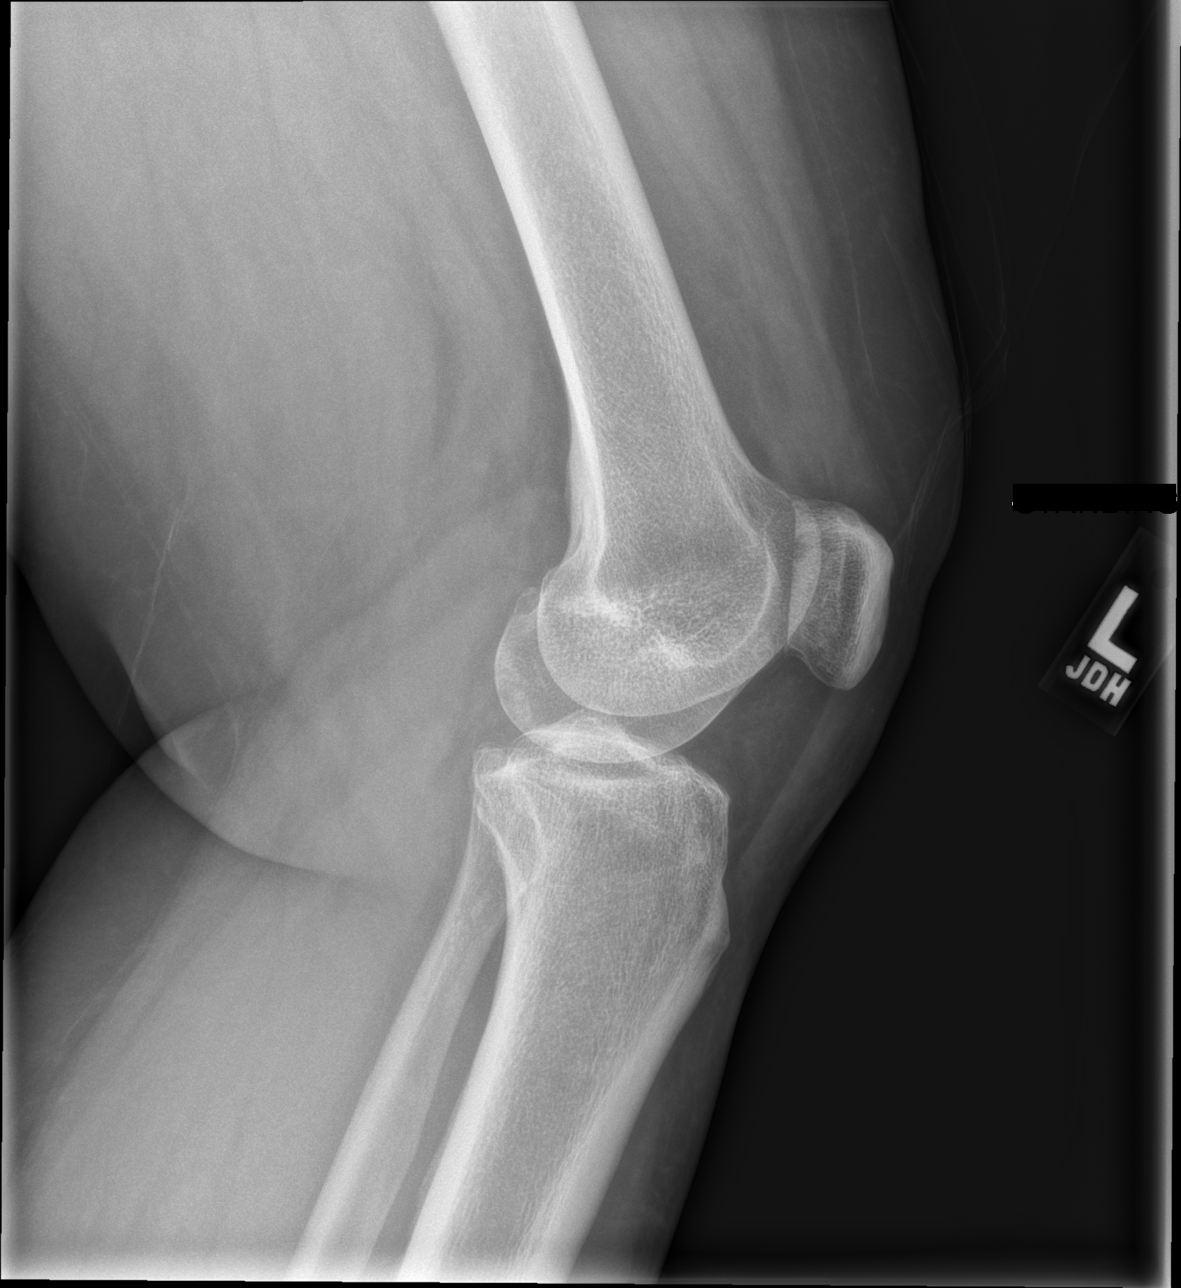

[x knee tunnel left]
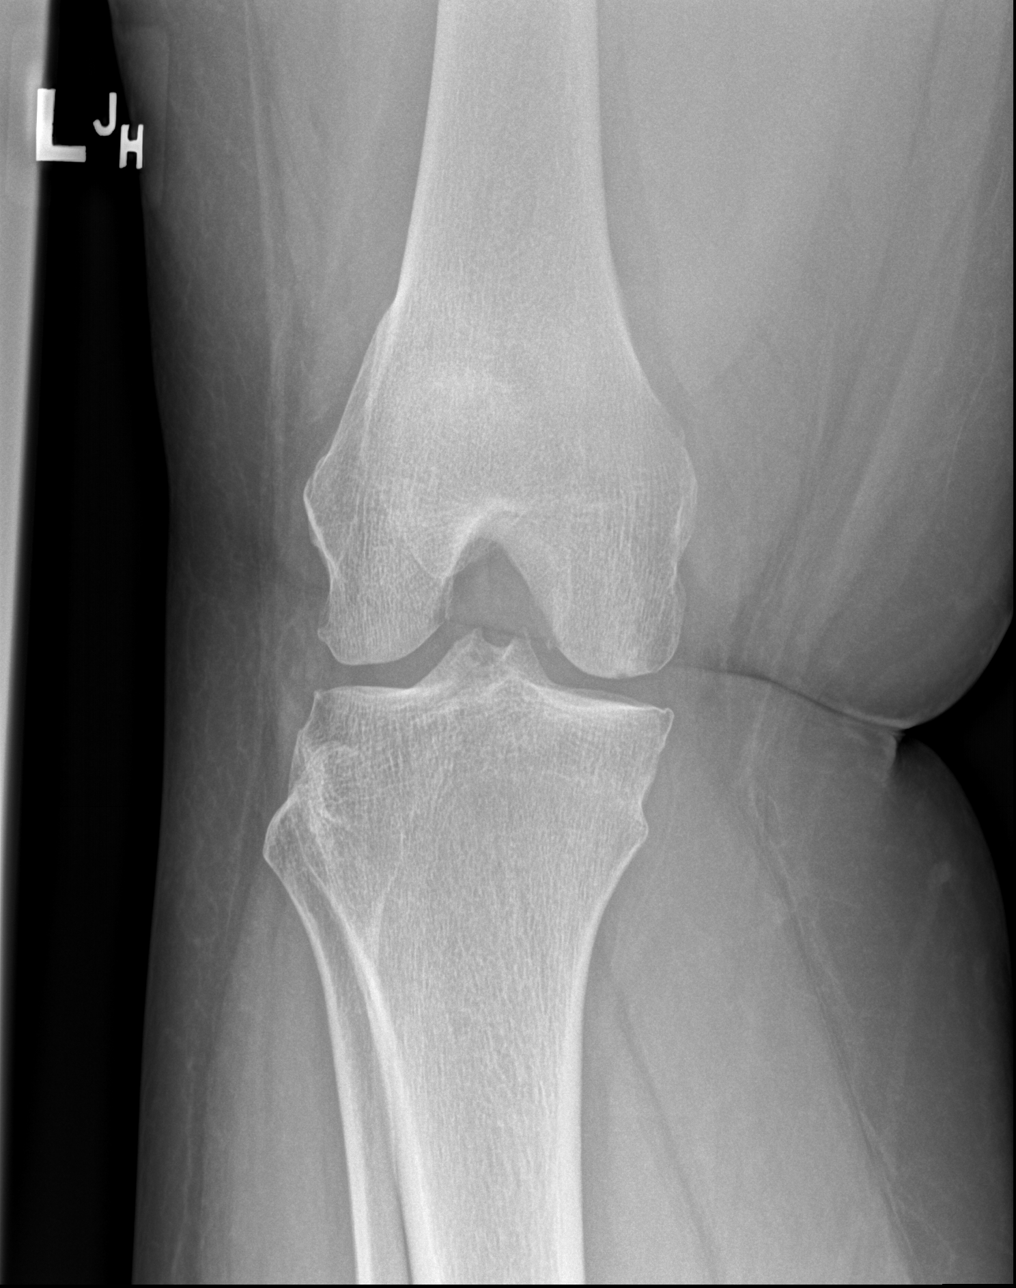

[x knee sunrise left]
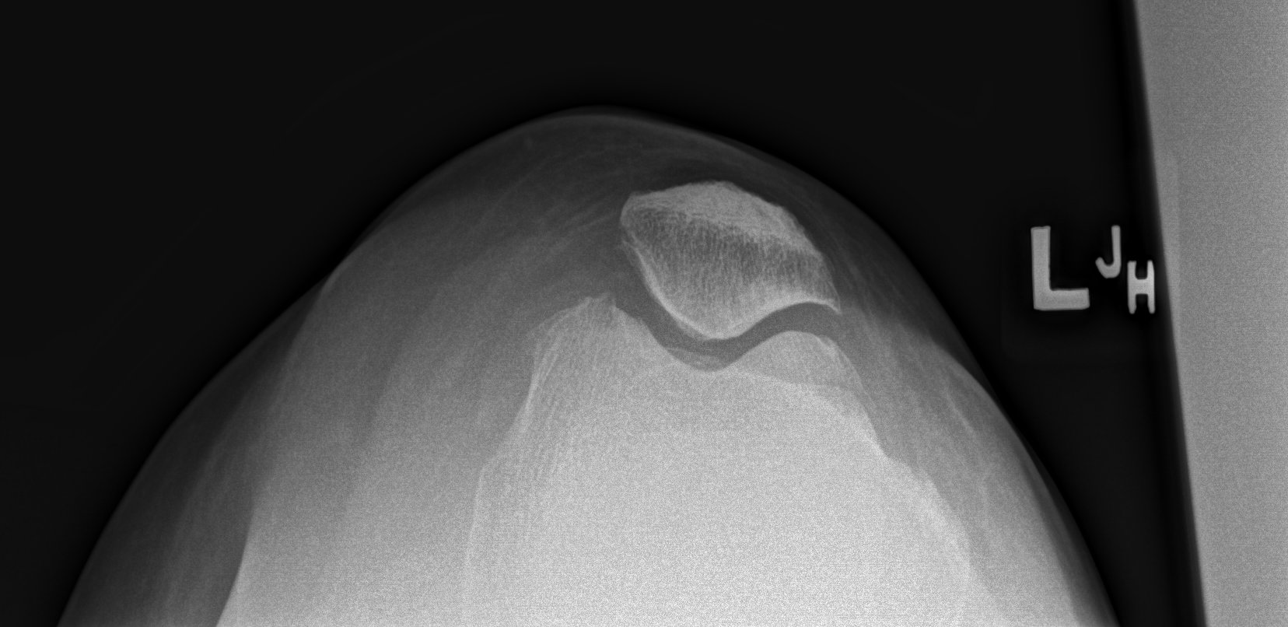

[4 of 4 positions shown; findings below may reference images not displayed]

FINDINGS: No evidence of fracture, dislocation, or joint effusion. No evidence
of arthropathy or other focal bone abnormality. Soft tissues are
unremarkable.
IMPRESSION: Normal left knee.

## 2018-07-22 ENCOUNTER — Ambulatory Visit (INDEPENDENT_AMBULATORY_CARE_PROVIDER_SITE_OTHER): Payer: BC Managed Care – PPO | Admitting: Family Medicine

## 2018-07-22 ENCOUNTER — Encounter: Payer: Self-pay | Admitting: Family Medicine

## 2018-07-22 DIAGNOSIS — C541 Malignant neoplasm of endometrium: Secondary | ICD-10-CM | POA: Diagnosis not present

## 2018-07-22 DIAGNOSIS — G8929 Other chronic pain: Secondary | ICD-10-CM

## 2018-07-22 DIAGNOSIS — R030 Elevated blood-pressure reading, without diagnosis of hypertension: Secondary | ICD-10-CM

## 2018-07-22 DIAGNOSIS — M25512 Pain in left shoulder: Secondary | ICD-10-CM

## 2018-07-22 DIAGNOSIS — N952 Postmenopausal atrophic vaginitis: Secondary | ICD-10-CM | POA: Diagnosis not present

## 2018-07-22 DIAGNOSIS — R739 Hyperglycemia, unspecified: Secondary | ICD-10-CM | POA: Insufficient documentation

## 2018-07-22 MED ORDER — DICLOFENAC SODIUM 75 MG PO TBEC: 75.0000 mg | DELAYED_RELEASE_TABLET | Freq: Two times a day (BID) | ORAL | 0 refills | Status: DC

## 2018-07-22 NOTE — Progress Notes (Signed)
Chief Complaint:  Suzanne [name] is a 53 y.o. female who presents today for a virtual office visit with a chief complaint of shoulder [name] and to establish care.   Assessment/Plan:  Shoulder [name] No red flags. Likely muscular strain. Start Diclofenac 75mg  bid x1-2 weeks. If not improvement, will need plain films and/or referral to sports medicine. Discussed reasons to return care.   Hyperglycemia Will follow up soon for CPE. Check A1c at that time.   Endometrial cancer [facility]) s/p hysterectomy Stable. Continue current management per GYN-ONC.   Elevated blood pressure reading Will follow up in 1-2 weeks for CPE. Will check BP at that time.   Preventative Healthcare Patient was instructed to return soon for CPE. Health Maintenance Due  Topic Date Due  . Hepatitis C Screening  01-25-1966  . HIV Screening  03/23/1978  . COLONOSCOPY  03/23/2013  . MAMMOGRAM  04/05/2014  . TETANUS/TDAP  03/11/2015  . PAP SMEAR-Modifier  09/07/2017      Subjective:  HPI:  Shoulder [name] Started 5 or 6 months ago.  Located to posterior aspect of her left shoulder.  She had been working out more at her gym at work and thinks that she could have strained a muscle.  No obvious injuries or other precipitating events.  No weakness or numbness.  Worse with moving shoulder from the side of her body across her chest.  Also worse with lying down.  No specific treatments tried.  She has never had anything like this in the past. No other obvious alleviating or aggravating factors.   Her other medical history /chronic problems are outlined below:  % History of endometrial Cancer s/p hysterectomy 2017 - Follows with GYN ONC twice yearly.  No signs of recurrence.  % Atrophic vaginitis - Managed by GYN.   - Uses estrogen suppositories  # History of Elevated blood pressure  - Not currently on any medications  # History of Hyperglycemia - Not currently on any medications  ROS: Per HPI, otherwise a  complete review of systems was negative.   PMH:  The following were reviewed and entered/updated in epic: Past Medical History:  Diagnosis Date  . Anemia    iron supplement used occ.  . Cancer [facility])    endometrial cancer dx.  . Ovarian tumor (benign)   . Shortness of breath dyspnea    rare times"feels like I get short of breath"-? related to anemia   Patient Active Problem List   Diagnosis Date Noted  . Atrophic vaginitis 07/22/2018  . 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 Elevated blood pressure reading 07/22/2018  . Hyperglycemia 07/22/2018  . Endometrial cancer [facility]) s/p hysterectomy 04/03/2015   Past Surgical History:  Procedure Laterality Date  . OVARIAN TUMOR REMOVAL     age 59 yrs old- [employer]  . ROBOTIC ASSISTED TOTAL HYSTERECTOMY WITH BILATERAL SALPINGO OOPHERECTOMY Bilateral 04/03/2015   Procedure: XI ROBOTIC ASSISTED TOTAL HYSTERECTOMY WITH BILATERAL SALPINGO OOPHORECTOMY WITH SENTINAL LYMPH NODE BIOPSY;  Surgeon: Everitt Amber, MD;  Location: [facility];  Service: Gynecology;  Laterality: Bilateral;    Family History  Problem Relation Age of Onset  . Arthritis Mother   . Breast cancer Mother   . Heart disease Maternal Uncle     Medications- reviewed and updated Current Outpatient Medications  Medication Sig Dispense Refill  . Estradiol [facility]) Place vaginally.    Marland Kitchen BIOTIN PO Take 1,000 mcg by mouth every morning.    . diclofenac (VOLTAREN) 75 MG EC  tablet Take 1 tablet (75 mg total) by mouth 2 (two) times daily. 30 tablet 0  . ibuprofen (ADVIL,MOTRIN) 200 MG tablet Take 200 mg by mouth every 6 (six) hours as needed for moderate [name].    . Multiple Vitamin (MULTIVITAMIN) tablet Take 1 tablet by mouth daily.     No current facility-administered medications for this visit.     Allergies-reviewed and updated No Known Allergies  Social History   Socioeconomic History  . Marital status: Widowed    Spouse name: Not on file  . Number of children: Not on  file  . Years of education: Not on file  . Highest education level: Not on file  Occupational History  . Not on file  Social Needs  . Financial resource strain: Not on file  . Food insecurity:    Worry: Not on file    Inability: Not on file  . Transportation needs:    Medical: Not on file    Non-medical: Not on file  Tobacco Use  . Smoking status: Never Smoker  . Smokeless tobacco: Never Used  Substance and Sexual Activity  . Alcohol use: Yes    Comment: OCCASIONAL  . Drug use: No    Types: Marijuana    Comment: none in 2 yrs-"don't expect to use again"  . Sexual activity: Not on file  Lifestyle  . Physical activity:    Days per week: Not on file    Minutes per session: Not on file  . Stress: Not on file  Relationships  . Social connections:    Talks on phone: Not on file    Gets together: Not on file    Attends religious service: Not on file    Active member of club or organization: Not on file    Attends meetings of clubs or organizations: Not on file    Relationship status: Not on file  Other Topics Concern  . Not on file  Social History Narrative  . Not on file         Objective/Observations  Physical Exam: Gen: NAD, resting comfortably Eyes:No scleral icterus. EOMI.  Cardiovascular: No peripheral edema Pulm: Normal work of breathing, speaking in full sentences Neuro: Grossly normal, moves all extremities MSK: shoulder without obvious lesions or deformities. Full range of motion.  Skin: No rashes or lesions Psych: Normal affect and thought content  Virtual Visit via Video   I connected with Suzanne [name] on 07/22/18 at  1:00 PM EDT by a video enabled telemedicine application and verified that I am speaking with the correct person using two identifiers. I discussed the limitations of evaluation and management by telemedicine and the availability of in person appointments. The patient expressed understanding and agreed to proceed.   Patient  location: Home Provider location: [place] participating in the virtual visit: Myself and Patient     Suzanne [name]. Suzanne Pain, MD 07/22/2018 2:12 PM

## 2018-07-22 NOTE — Assessment & Plan Note (Signed)
Stable. Continue current management per GYN-ONC.

## 2018-07-22 NOTE — Assessment & Plan Note (Signed)
Will follow up in 1-2 weeks for CPE. Will check BP at that time.

## 2018-07-22 NOTE — Assessment & Plan Note (Signed)
Will follow up soon for CPE. Check A1c at that time.

## 2019-03-14 LAB — COLOGUARD: Cologuard: NEGATIVE

## 2019-04-05 ENCOUNTER — Other Ambulatory Visit: Payer: Self-pay | Admitting: Obstetrics and Gynecology

## 2019-04-05 DIAGNOSIS — R928 Other abnormal and inconclusive findings on diagnostic imaging of breast: Secondary | ICD-10-CM

## 2019-04-14 ENCOUNTER — Inpatient Hospital Stay: Admission: RE | Admit: 2019-04-14 | Payer: BC Managed Care – PPO | Source: Ambulatory Visit

## 2019-04-14 ENCOUNTER — Other Ambulatory Visit: Payer: BC Managed Care – PPO

## 2019-04-26 ENCOUNTER — Ambulatory Visit
Admission: RE | Admit: 2019-04-26 | Discharge: 2019-04-26 | Disposition: A | Discharge status: Home / Self Care | Payer: BC Managed Care – PPO | Source: Ambulatory Visit | Attending: Obstetrics and Gynecology | Admitting: Obstetrics and Gynecology

## 2019-04-26 ENCOUNTER — Other Ambulatory Visit: Payer: Self-pay

## 2019-04-26 ENCOUNTER — Other Ambulatory Visit: Payer: Self-pay | Admitting: Obstetrics and Gynecology

## 2019-04-26 DIAGNOSIS — N6489 Other specified disorders of breast: Secondary | ICD-10-CM

## 2019-04-26 DIAGNOSIS — R928 Other abnormal and inconclusive findings on diagnostic imaging of breast: Secondary | ICD-10-CM

## 2019-04-28 ENCOUNTER — Ambulatory Visit
Admission: RE | Admit: 2019-04-28 | Discharge: 2019-04-28 | Disposition: A | Discharge status: Home / Self Care | Payer: BC Managed Care – PPO | Source: Ambulatory Visit | Attending: Obstetrics and Gynecology | Admitting: Obstetrics and Gynecology

## 2019-04-28 ENCOUNTER — Other Ambulatory Visit: Payer: Self-pay

## 2019-04-28 DIAGNOSIS — N6489 Other specified disorders of breast: Secondary | ICD-10-CM

## 2020-01-05 ENCOUNTER — Telehealth: Payer: Self-pay | Admitting: Family Medicine

## 2020-01-05 NOTE — Telephone Encounter (Signed)
TOC scheduled

## 2020-01-05 NOTE — Telephone Encounter (Signed)
[place] with me.  Algis Greenhouse. Jerline Pain, MD 01/05/2020 2:35 PM

## 2020-01-05 NOTE — Telephone Encounter (Signed)
Caller Name: [place] Call back phone #: [phone]  Reason for Call: pt is wanting to transfer from Dr. Jerline Pain to Dr. Bryan Lemma due to [facility] being much closer to her home. Please advise.

## 2020-01-05 NOTE — Telephone Encounter (Signed)
LM for pt to call back to schedule TOC to Dr. Bryan Lemma

## 2020-01-05 NOTE — Telephone Encounter (Signed)
Ok with me 

## 2020-02-10 ENCOUNTER — Other Ambulatory Visit: Payer: Self-pay

## 2020-02-16 ENCOUNTER — Encounter: Payer: Self-pay | Admitting: Family Medicine

## 2020-02-16 ENCOUNTER — Ambulatory Visit: Payer: BC Managed Care – PPO | Admitting: Family Medicine

## 2020-02-16 ENCOUNTER — Other Ambulatory Visit: Payer: Self-pay

## 2020-02-16 VITALS — BP 124/80 | HR 86 | Temp 97.8°F | Ht 64.0 in | Wt 231.0 lb

## 2020-02-16 DIAGNOSIS — M25562 Pain in left knee: Secondary | ICD-10-CM

## 2020-02-16 DIAGNOSIS — Z7689 Persons encountering health services in other specified circumstances: Secondary | ICD-10-CM | POA: Diagnosis not present

## 2020-02-16 DIAGNOSIS — G8929 Other chronic pain: Secondary | ICD-10-CM | POA: Diagnosis not present

## 2020-02-16 DIAGNOSIS — Z2821 Immunization not carried out because of patient refusal: Secondary | ICD-10-CM | POA: Diagnosis not present

## 2020-02-16 NOTE — Patient Instructions (Signed)
Ibuprofen 600mg  2x/day with food x 5-7 days

## 2020-02-16 NOTE — Progress Notes (Signed)
Suzanne [name] is a 54 y.o. female  Chief Complaint  Patient presents with  . Establish Care    TOC- LT knee pain x years off/on.  Also c/o having elevated BP when at the OB-GYN.    [place] flu shot today    HPI: Suzanne [name] is a 54 y.o. female patient here to establish care with our office. She follows with GYN Dr. Matthew Saras. Previous PCP was Dr. Jerline Pain but our office is more geographically desirable for pt.  She complains of Lt knee pain on/off x years. Pt recently started working out with a Physiological scientist and doing strengthening exercises for quads and feels symptoms have improved. She has never seen ortho, sports med for this.   Specialists: Gyn - Dr. Matthew Saras ([facility])  Last PAP: s/p TAH for endometrial cancer 03/2015 Last mammo: 04/2019 Last colonoscopy: Cologuard done 03/2019 - negative (due in 2024)   Past Medical History:  Diagnosis Date  . Anemia    iron supplement used occ.  . Cancer [facility])    endometrial cancer dx.  . Ovarian tumor (benign)   . Shortness of breath dyspnea    rare times"feels like I get short of breath"-? related to anemia    Past Surgical History:  Procedure Laterality Date  . OVARIAN TUMOR REMOVAL     age 7 yrs old- [facility]  . ROBOTIC ASSISTED TOTAL HYSTERECTOMY WITH BILATERAL SALPINGO OOPHERECTOMY Bilateral 04/03/2015   Procedure: XI ROBOTIC ASSISTED TOTAL HYSTERECTOMY WITH BILATERAL SALPINGO OOPHORECTOMY WITH SENTINAL LYMPH NODE BIOPSY;  Surgeon: Everitt Amber, MD;  Location: [facility];  Service: Gynecology;  Laterality: Bilateral;    Social History   Socioeconomic History  . Marital status: Widowed    Spouse name: Not on file  . Number of children: Not on file  . Years of education: Not on file  . Highest education level: Not on file  Occupational History  . Not on file  Tobacco Use  . Smoking status: Never Smoker  . Smokeless tobacco: Never Used  Vaping Use  . Vaping Use: Never used  Substance and Sexual  Activity  . Alcohol use: Yes    Comment: OCCASIONAL  . Drug use: No    Types: Marijuana    Comment: none in 2 yrs-"don't expect to use again"  . Sexual activity: Yes  Other Topics Concern  . Not on file  Social History Narrative  . Not on file   Social Determinants of Health   Financial Resource Strain: Not on file  Food Insecurity: Not on file  Transportation Needs: Not on file  Physical Activity: Not on file  Stress: Not on file  Social Connections: Not on file  Intimate Partner Violence: Not on file    Family History  Problem Relation Age of Onset  . Arthritis Mother   . Breast cancer Mother   . Heart disease Maternal Uncle      Immunization History  Administered Date(s) Administered  . PFIZER SARS-COV-2 Vaccination 06/29/2019, 07/20/2019  . Tdap 03/10/2005    Outpatient Encounter Medications as of 02/16/2020  Medication Sig  . BIOTIN PO Take 1,000 mcg by mouth every morning.  . diclofenac (VOLTAREN) 75 MG EC tablet Take 1 tablet (75 mg total) by mouth 2 (two) times daily.  . Estradiol [facility]) Place vaginally.  Marland Kitchen ibuprofen (ADVIL,MOTRIN) 200 MG tablet Take 200 mg by mouth every 6 (six) hours as needed for moderate pain.  . Multiple Vitamin (MULTIVITAMIN) tablet Take 1 tablet by  mouth daily.   No facility-administered encounter medications on file as of 02/16/2020.     ROS: Pertinent positives and negatives noted in HPI. Remainder of ROS non-contributory    No Known Allergies  BP 124/80   Pulse 86   Temp 97.8 F (36.6 C) (Temporal)   Ht 5\' 4"  (1.626 m)   Wt 231 lb (104.8 kg)   SpO2 97%   BMI 39.65 kg/m   Physical Exam Constitutional:      General: She is not in acute distress.    Appearance: Normal appearance. She is not ill-appearing.  Pulmonary:     Breath sounds: No wheezing.  Neurological:     General: No focal deficit present.     Mental Status: She is alert and oriented to person, place, and time.  Psychiatric:        Mood and  Affect: Mood normal.        Behavior: Behavior normal.      A/P:  1. Influenza vaccination declined by patient  2. Encounter to establish care with new doctor - pt to schedule CPE, fasting labs  3. Chronic pain of left knee - on/off x years - some improvement with strengthening exercises recently  - pt will continue with exercise and let me know if pain does not continue to improve with exercise and weight loss    This visit occurred during the SARS-CoV-2 public health emergency.  Safety protocols were in place, including screening questions prior to the visit, additional usage of staff PPE, and extensive cleaning of exam room while observing appropriate contact time as indicated for disinfecting solutions.

## 2020-02-29 ENCOUNTER — Ambulatory Visit: Payer: BC Managed Care – PPO | Attending: Family

## 2020-02-29 DIAGNOSIS — Z23 Encounter for immunization: Secondary | ICD-10-CM

## 2020-08-08 DEATH — deceased

## 2021-02-14 LAB — HM PAP SMEAR: HM Pap smear: NORMAL

## 2021-02-14 LAB — RESULTS CONSOLE HPV: CHL HPV: NEGATIVE

## 2021-04-08 LAB — HM MAMMOGRAPHY

## 2021-08-06 NOTE — Progress Notes (Unsigned)
There were no vitals taken for this visit.   Subjective:    Patient ID: Suzanne [name], female    DOB: 1966-02-06, 56 y.o.   MRN: [id number]  CC: No chief complaint on file.   HPI: Suzanne [name] is a 56 y.o. female presenting on 08/07/2021 to transfer care to a new provider and for comprehensive medical examination. Current medical complaints include:{Blank single:19197::"none","***"}  She currently lives with: Menopausal Symptoms: {Blank single:19197::"yes","no"}  Depression Screen done today and results listed below:     02/16/2020    3:14 PM  Depression screen PHQ 2/9  Decreased Interest 1  Down, Depressed, Hopeless 1  PHQ - 2 Score 2    The patient {has/does not have:19849} a history of falls. I {did/did not:19850} complete a risk assessment for falls. A plan of care for falls {was/was not:19852} documented.   Past Medical History:  Past Medical History:  Diagnosis Date   Anemia    iron supplement used occ.   Cancer [facility])    endometrial cancer dx.   Ovarian tumor (benign)    Shortness of breath dyspnea    rare times"feels like I get short of breath"-? related to anemia    Surgical History:  Past Surgical History:  Procedure Laterality Date   OVARIAN TUMOR REMOVAL     age 84 yrs old- [place] WITH BILATERAL SALPINGO OOPHERECTOMY Bilateral 04/03/2015   Procedure: XI ROBOTIC ASSISTED TOTAL HYSTERECTOMY WITH BILATERAL SALPINGO OOPHORECTOMY WITH SENTINAL LYMPH NODE BIOPSY;  Surgeon: Everitt Amber, MD;  Location: [facility];  Service: Gynecology;  Laterality: Bilateral;    Medications:  Current Outpatient Medications on File Prior to Visit  Medication Sig   BIOTIN PO Take 1,000 mcg by mouth every morning.   diclofenac (VOLTAREN) 75 MG EC tablet Take 1 tablet (75 mg total) by mouth 2 (two) times daily.   Estradiol [facility]) Place vaginally.   ibuprofen (ADVIL,MOTRIN) 200 MG tablet Take 200 mg by mouth every 6 (six)  hours as needed for moderate pain.   Multiple Vitamin (MULTIVITAMIN) tablet Take 1 tablet by mouth daily.   No current facility-administered medications on file prior to visit.    Allergies:  No Known Allergies  Social History:  Social History   Socioeconomic History   Marital status: Widowed    Spouse name: Not on file   Number of children: Not on file   Years of education: Not on file   Highest education level: Not on file  Occupational History   Not on file  Tobacco Use   Smoking status: Never   Smokeless tobacco: Never  Vaping Use   Vaping Use: Never used  Substance and Sexual Activity   Alcohol use: Yes    Comment: OCCASIONAL   Drug use: No    Types: Marijuana    Comment: none in 2 yrs-"don't expect to use again"   Sexual activity: Yes  Other Topics Concern   Not on file  Social History Narrative   Not on file   Social Determinants of Health   Financial Resource Strain: Not on file  Food Insecurity: Not on file  Transportation Needs: Not on file  Physical Activity: Not on file  Stress: Not on file  Social Connections: Not on file  Intimate Partner Violence: Not on file   Social History   Tobacco Use  Smoking Status Never  Smokeless Tobacco Never   Social History   Substance and Sexual Activity  Alcohol Use  Yes   Comment: OCCASIONAL    Family History:  Family History  Problem Relation Age of Onset   Arthritis Mother    Breast cancer Mother    Heart disease Maternal Uncle     Past medical history, surgical history, medications, allergies, family history and social history reviewed with patient today and changes made to appropriate areas of the chart.   ROS All other ROS negative except what is listed above and in the HPI.      Objective:    There were no vitals taken for this visit.  Wt Readings from Last 3 Encounters:  02/16/20 231 lb (104.8 kg)  01/28/17 225 lb 4.8 oz (102.2 kg)  11/28/15 230 lb 3.2 oz (104.4 kg)    Physical  Exam  Results for orders placed or performed in visit on 04/13/15  Urine culture   Specimen: Urine  Result Value Ref Range   Urine Culture, Routine Final report    Organism ID, Bacteria Comment   Urinalysis, Microscopic - [facility]  Result Value Ref Range   Glucose Negative Negative mg/dL   Bilirubin (Urine) Negative Negative   Ketones Negative Negative mg/dL   Specific Gravity, Urine 1.005 1.003 - 1.035   Blood Negative Negative   pH 7.5 4.6 - 8.0   Protein Negative Negative- <30 mg/dL   Urobilinogen, UR 0.2 0.2 - 1 mg/dL   Nitrite Negative Negative   Leukocyte Esterase Negative Negative   RBC / HPF 0-2 0 - 2   WBC, UA 0-2 0 - 2   Bacteria, UA Few Negative- Trace   Epithelial Cells Occasional Negative- Few      Assessment & Plan:   Problem List Items Addressed This Visit   None    Follow up plan: No follow-ups on file.   LABORATORY TESTING:  - Pap smear: {Blank VZDGLO:75643::"PIR done","not applicable","up to date","done elsewhere"}  IMMUNIZATIONS:   - Tdap: Tetanus vaccination status reviewed: {tetanus status:315746}. - Influenza: {Blank single:19197::"Up to date","Administered today","Postponed to flu season","Refused","Given elsewhere"} - Pneumovax: {Blank single:19197::"Up to date","Administered today","Not applicable","Refused","Given elsewhere"} - Prevnar: {Blank single:19197::"Up to date","Administered today","Not applicable","Refused","Given elsewhere"} - HPV: {Blank single:19197::"Up to date","Administered today","Not applicable","Refused","Given elsewhere"} - Zostavax vaccine: {Blank single:19197::"Up to date","Administered today","Not applicable","Refused","Given elsewhere"}  SCREENING: -Mammogram: {Blank single:19197::"Up to date","Ordered today","Not applicable","Refused","Done elsewhere"}  - Colonoscopy: {Blank single:19197::"Up to date","Ordered today","Not applicable","Refused","Done elsewhere"}  - Bone Density: {Blank single:19197::"Up to date","Ordered  today","Not applicable","Refused","Done elsewhere"}  -Hearing Test: {Blank single:19197::"Up to date","Ordered today","Not applicable","Refused","Done elsewhere"}  -Spirometry: {Blank single:19197::"Up to date","Ordered today","Not applicable","Refused","Done elsewhere"}   PATIENT COUNSELING:   Advised to take 1 mg of folate supplement per day if capable of pregnancy.   Sexuality: Discussed sexually transmitted diseases, partner selection, use of condoms, avoidance of unintended pregnancy  and contraceptive alternatives.   Advised to avoid cigarette smoking.  I discussed with the patient that most people either abstain from alcohol or drink within safe limits (<=14/week and <=4 drinks/occasion for males, <=7/weeks and <= 3 drinks/occasion for females) and that the risk for alcohol disorders and other health effects rises proportionally with the number of drinks per week and how often a drinker exceeds daily limits.  Discussed cessation/primary prevention of drug use and availability of treatment for abuse.   Diet: Encouraged to adjust caloric intake to maintain  or achieve ideal body weight, to reduce intake of dietary saturated fat and total fat, to limit sodium intake by avoiding high sodium foods and not adding table salt, and to maintain adequate dietary potassium and calcium  preferably from fresh fruits, vegetables, and low-fat dairy products.    stressed the importance of regular exercise  Injury prevention: Discussed safety belts, safety helmets, smoke detector, smoking near bedding or upholstery.   Dental health: Discussed importance of regular tooth brushing, flossing, and dental visits.    NEXT PREVENTATIVE PHYSICAL DUE IN 1 YEAR. No follow-ups on file.

## 2021-08-07 ENCOUNTER — Encounter: Payer: Self-pay | Admitting: Nurse Practitioner

## 2021-08-07 ENCOUNTER — Ambulatory Visit: Payer: BC Managed Care – PPO | Admitting: Nurse Practitioner

## 2021-08-07 VITALS — BP 126/84 | HR 60 | Temp 97.5°F | Ht 63.0 in | Wt 183.2 lb

## 2021-08-07 DIAGNOSIS — E7800 Pure hypercholesterolemia, unspecified: Secondary | ICD-10-CM | POA: Diagnosis not present

## 2021-08-07 DIAGNOSIS — R7303 Prediabetes: Secondary | ICD-10-CM | POA: Insufficient documentation

## 2021-08-07 DIAGNOSIS — Z0000 Encounter for general adult medical examination without abnormal findings: Secondary | ICD-10-CM | POA: Diagnosis not present

## 2021-08-07 DIAGNOSIS — E669 Obesity, unspecified: Secondary | ICD-10-CM

## 2021-08-07 DIAGNOSIS — G629 Polyneuropathy, unspecified: Secondary | ICD-10-CM | POA: Diagnosis not present

## 2021-08-07 LAB — POCT GLYCOSYLATED HEMOGLOBIN (HGB A1C): Hemoglobin A1C: 6 % — AB (ref 4.0–5.6)

## 2021-08-07 NOTE — Assessment & Plan Note (Signed)
A1c today is 6.0%. Discussed nutrition and exercise. Follow-up in 1 year or sooner with concerns.

## 2021-08-07 NOTE — Assessment & Plan Note (Signed)
She has a history of elevated total cholesterol and LDL cholesterol. Will repeat lipid panel today and treat based on results.

## 2021-08-07 NOTE — Patient Instructions (Addendum)
It was great to see you!  Let's follow-up in 8 weeks, sooner if you have concerns.  If a referral was placed today, you will be contacted for an appointment. Please note that routine referrals can sometimes take up to 3-4 weeks to process. Please call our office if you haven't heard anything after this time frame.  Take care,  Vance Peper, NP

## 2021-08-07 NOTE — Assessment & Plan Note (Signed)
BMI 32.4. Discussed nutrition and exercise.

## 2021-08-07 NOTE — Assessment & Plan Note (Signed)
She has been experiencing numbness in her fingertips for several months, she is wondering about her A1c. POC A1c in office 6.0% consistent with prediabetes. Will check TSH and vitamin B12 today.

## 2021-08-08 LAB — COMPREHENSIVE METABOLIC PANEL
ALT: 18 U/L (ref 0–35)
AST: 26 U/L (ref 0–37)
Albumin: 3.9 g/dL (ref 3.5–5.2)
Alkaline Phosphatase: 58 U/L (ref 39–117)
BUN: 22 mg/dL (ref 6–23)
CO2: 29 mEq/L (ref 19–32)
Calcium: 9.2 mg/dL (ref 8.4–10.5)
Chloride: 105 mEq/L (ref 96–112)
Creatinine, Ser: 1.11 mg/dL (ref 0.40–1.20)
GFR: 54.84 mL/min — ABNORMAL LOW (ref >60.00)
Glucose, Bld: 95 mg/dL (ref 70–99)
Potassium: 4.7 mEq/L (ref 3.5–5.1)
Sodium: 140 mEq/L (ref 135–145)
Total Bilirubin: 0.3 mg/dL (ref 0.2–1.2)
Total Protein: 6.8 g/dL (ref 6.0–8.3)

## 2021-08-08 LAB — CBC WITH DIFFERENTIAL/PLATELET
Basophils Absolute: 0.1 10*3/uL (ref 0.0–0.1)
Basophils Relative: 1.1 % (ref 0.0–3.0)
Eosinophils Absolute: 0.1 10*3/uL (ref 0.0–0.7)
Eosinophils Relative: 1.4 % (ref 0.0–5.0)
HCT: 36.3 % (ref 36.0–46.0)
Hemoglobin: 12.1 g/dL (ref 12.0–15.0)
Lymphocytes Relative: 39.2 % (ref 12.0–46.0)
Lymphs Abs: 2.2 10*3/uL (ref 0.7–4.0)
MCHC: 33.2 g/dL (ref 30.0–36.0)
MCV: 95.3 fl (ref 78.0–100.0)
Monocytes Absolute: 0.4 10*3/uL (ref 0.1–1.0)
Monocytes Relative: 7.6 % (ref 3.0–12.0)
Neutro Abs: 2.8 10*3/uL (ref 1.4–7.7)
Neutrophils Relative %: 50.7 % (ref 43.0–77.0)
Platelets: 232 10*3/uL (ref 150.0–400.0)
RBC: 3.81 Mil/uL — ABNORMAL LOW (ref 3.87–5.11)
RDW: 13.6 % (ref 11.5–15.5)
WBC: 5.6 10*3/uL (ref 4.0–10.5)

## 2021-08-08 LAB — VITAMIN B12: Vitamin B-12: 944 pg/mL — ABNORMAL HIGH (ref 211–911)

## 2021-08-08 LAB — LIPID PANEL
Cholesterol: 223 mg/dL — ABNORMAL HIGH (ref 0–200)
HDL: 63.5 mg/dL (ref >39.00)
LDL Cholesterol: 126 mg/dL — ABNORMAL HIGH (ref 0–99)
NonHDL: 159.63
Total CHOL/HDL Ratio: 4
Triglycerides: 169 mg/dL — ABNORMAL HIGH (ref 0.0–149.0)
VLDL: 33.8 mg/dL (ref 0.0–40.0)

## 2021-08-08 LAB — TSH: TSH: 2.16 u[IU]/mL (ref 0.35–5.50)

## 2021-08-16 ENCOUNTER — Encounter: Payer: Self-pay | Admitting: Nurse Practitioner

## 2021-09-24 ENCOUNTER — Encounter: Payer: Self-pay | Admitting: Nurse Practitioner

## 2022-01-22 ENCOUNTER — Telehealth: Payer: Self-pay | Admitting: Nurse Practitioner

## 2022-01-22 NOTE — Telephone Encounter (Signed)
Pt has been notified that  per her request letter from 08/07/2021 about a gym membership has been sent to via [place], as well as reprinted and will be in the front office ready to be picked up on Friday 01/24/2022. Pt verbalized under standing

## 2022-01-22 NOTE — Telephone Encounter (Signed)
Caller Name: Shaconda Hajduk  Call back phone #: [phone]  Reason for Call: Pt had been given a letter from [place] recommending a gym membership. She has misplaced this letter and is asking for it to be reprinted. She is available to pick up physical copy on Friday, please also upload to [place]

## 2022-02-25 ENCOUNTER — Telehealth: Payer: Self-pay | Admitting: Nurse Practitioner

## 2022-02-25 NOTE — Telephone Encounter (Signed)
PAPERWORK/FORMS received  Dropped off by: Clabe Seal Call back #: [phone] Individual made aware of 3-5 business day turn around (YES/NO): yes GREEN charge sheet completed and patient made aware of possible charge (YES/NO): yes Placed in provider folder at front desk. ~~~ route to CMA/provider Team  CLINICAL USE BELOW THIS LINE (use X to signify action taken)  ___ Form received and placed in providers office for signature. ___ Form completed and faxed to LOA Dept.  ___ Form completed & LVM to notify patient ready for pick up.  ___ Charge sheet and copy of form in front office folder for office supervisor.

## 2022-02-25 NOTE — Telephone Encounter (Signed)
CLINICAL USE BELOW THIS LINE (use X to signify action taken)   _x__ Form received and placed in providers office for signature. ___ Form completed and faxed to LOA Dept.  ___ Form completed & LVM to notify patient ready for pick up.  ___ Charge sheet and copy of form in front office folder for office supervisor.

## 2022-02-26 ENCOUNTER — Telehealth: Payer: Self-pay | Admitting: Nurse Practitioner

## 2022-02-26 NOTE — Telephone Encounter (Signed)
Duplicate

## 2022-02-26 NOTE — Telephone Encounter (Signed)
Pt said she dropped a letter off yesterday and she needed dated for 5.31.2023 . If any other questions please call pt

## 2022-02-26 NOTE — Telephone Encounter (Signed)
Spoke with patient who states that she needs something in writing regarding reason for her gym member ship so that she could be reimbursed through her insurance.

## 2022-02-26 NOTE — Telephone Encounter (Signed)
Called patient for clarification of form that she needs filled out. No answer LMTCB

## 2022-02-28 ENCOUNTER — Telehealth: Payer: Self-pay

## 2022-02-28 NOTE — Telephone Encounter (Signed)
CLINICAL USE BELOW THIS LINE (use X to signify action taken)   __ Form received and placed in providers office for signature. __X_ Form completed and faxed to LOA Dept.  __X_ Form completed & LVM to notify patient ready for pick up.  ___ Charge sheet and copy of form in front office folder for office supervisor.      Spoke with patient and she verbalized understanding. She will come by the office to pick up her copy. Placed copy up front.

## 2022-03-04 NOTE — Telephone Encounter (Signed)
Pt came by to pick up form. She is needing the date changed to 08/07/21. Please advise pt

## 2022-03-04 NOTE — Telephone Encounter (Signed)
Pt advised that her treatment/ diagnosis dates are correct and included on her forms, the date of 02/27/2022 was the date that the form was filled out so Ander Purpura will not be changing that.

## 2023-06-08 ENCOUNTER — Encounter: Payer: Self-pay | Admitting: Nurse Practitioner

## 2023-10-21 ENCOUNTER — Encounter: Payer: Self-pay | Admitting: Nurse Practitioner
